# Patient Record
Sex: Male | Born: 1950 | Race: White | Hispanic: No | Marital: Married | Smoking: Former smoker
Health system: Southern US, Community
[De-identification: ages and names within clinical notes are randomized; demographics above are authoritative.]

## PROBLEM LIST (undated history)

## (undated) DIAGNOSIS — M1990 Unspecified osteoarthritis, unspecified site: Secondary | ICD-10-CM

## (undated) DIAGNOSIS — K649 Unspecified hemorrhoids: Secondary | ICD-10-CM

## (undated) DIAGNOSIS — E785 Hyperlipidemia, unspecified: Secondary | ICD-10-CM

## (undated) DIAGNOSIS — K5790 Diverticulosis of intestine, part unspecified, without perforation or abscess without bleeding: Secondary | ICD-10-CM

## (undated) DIAGNOSIS — E860 Dehydration: Secondary | ICD-10-CM

## (undated) DIAGNOSIS — H269 Unspecified cataract: Secondary | ICD-10-CM

## (undated) HISTORY — DX: Unspecified cataract: H26.9

## (undated) HISTORY — DX: Dehydration: E86.0

## (undated) HISTORY — DX: Hyperlipidemia, unspecified: E78.5

## (undated) HISTORY — DX: Unspecified hemorrhoids: K64.9

## (undated) HISTORY — DX: Diverticulosis of intestine, part unspecified, without perforation or abscess without bleeding: K57.90

---

## 1997-04-21 ENCOUNTER — Encounter: Payer: Self-pay | Admitting: Family Medicine

## 1997-04-21 LAB — CONVERTED CEMR LAB: PSA: 1 ng/mL

## 1998-02-20 ENCOUNTER — Encounter: Admission: RE | Admit: 1998-02-20 | Discharge: 1998-05-21 | Payer: Self-pay | Admitting: *Deleted

## 1998-05-07 HISTORY — PX: OTHER SURGICAL HISTORY: SHX169

## 2000-06-26 ENCOUNTER — Encounter: Payer: Self-pay | Admitting: Family Medicine

## 2000-06-26 LAB — CONVERTED CEMR LAB: PSA: 0.7 ng/mL

## 2002-06-21 ENCOUNTER — Encounter: Payer: Self-pay | Admitting: Family Medicine

## 2004-12-28 ENCOUNTER — Ambulatory Visit: Payer: Self-pay | Admitting: Family Medicine

## 2005-06-15 ENCOUNTER — Ambulatory Visit: Payer: Self-pay | Admitting: Family Medicine

## 2005-06-17 ENCOUNTER — Ambulatory Visit: Payer: Self-pay | Admitting: Family Medicine

## 2005-07-04 ENCOUNTER — Ambulatory Visit: Payer: Self-pay | Admitting: Family Medicine

## 2006-07-03 ENCOUNTER — Ambulatory Visit: Payer: Self-pay | Admitting: Family Medicine

## 2006-07-07 ENCOUNTER — Ambulatory Visit: Payer: Self-pay | Admitting: Family Medicine

## 2006-07-21 LAB — FECAL OCCULT BLOOD, GUAIAC: Fecal Occult Blood: NEGATIVE

## 2006-07-24 ENCOUNTER — Ambulatory Visit: Payer: Self-pay | Admitting: Family Medicine

## 2007-03-05 ENCOUNTER — Telehealth (INDEPENDENT_AMBULATORY_CARE_PROVIDER_SITE_OTHER): Payer: Self-pay | Admitting: *Deleted

## 2007-03-05 ENCOUNTER — Encounter: Payer: Self-pay | Admitting: Family Medicine

## 2007-03-06 ENCOUNTER — Ambulatory Visit: Payer: Self-pay | Admitting: Family Medicine

## 2007-03-06 DIAGNOSIS — B029 Zoster without complications: Secondary | ICD-10-CM | POA: Insufficient documentation

## 2007-03-09 ENCOUNTER — Telehealth (INDEPENDENT_AMBULATORY_CARE_PROVIDER_SITE_OTHER): Payer: Self-pay | Admitting: *Deleted

## 2007-03-15 ENCOUNTER — Telehealth (INDEPENDENT_AMBULATORY_CARE_PROVIDER_SITE_OTHER): Payer: Self-pay | Admitting: *Deleted

## 2008-12-30 ENCOUNTER — Ambulatory Visit: Payer: Self-pay | Admitting: Family Medicine

## 2009-04-28 ENCOUNTER — Ambulatory Visit: Payer: Self-pay | Admitting: Family Medicine

## 2009-04-30 LAB — CONVERTED CEMR LAB
Albumin: 3.8 g/dL (ref 3.5–5.2)
BUN: 12 mg/dL (ref 6–23)
Calcium: 9.3 mg/dL (ref 8.4–10.5)
Direct LDL: 138.5 mg/dL
GFR calc non Af Amer: 81 mL/min (ref >60)
Glucose, Bld: 106 mg/dL — ABNORMAL HIGH (ref 70–99)
HDL: 31.5 mg/dL — ABNORMAL LOW (ref >39.00)
PSA: 1.03 ng/mL (ref 0.10–4.00)
TSH: 1.48 microintl units/mL (ref 0.35–5.50)
Triglycerides: 131 mg/dL (ref 0.0–149.0)

## 2009-05-07 ENCOUNTER — Ambulatory Visit: Payer: Self-pay | Admitting: Family Medicine

## 2009-05-07 LAB — CONVERTED CEMR LAB: Microalb, Ur: 0.3 mg/dL (ref 0.0–1.9)

## 2009-05-15 ENCOUNTER — Ambulatory Visit: Payer: Self-pay | Admitting: Family Medicine

## 2009-06-01 ENCOUNTER — Encounter (INDEPENDENT_AMBULATORY_CARE_PROVIDER_SITE_OTHER): Payer: Self-pay | Admitting: *Deleted

## 2009-10-21 ENCOUNTER — Ambulatory Visit: Payer: Self-pay | Admitting: Family Medicine

## 2009-10-21 DIAGNOSIS — M719 Bursopathy, unspecified: Secondary | ICD-10-CM

## 2009-10-21 DIAGNOSIS — M67919 Unspecified disorder of synovium and tendon, unspecified shoulder: Secondary | ICD-10-CM | POA: Insufficient documentation

## 2010-05-05 ENCOUNTER — Ambulatory Visit: Payer: Self-pay | Admitting: Family Medicine

## 2010-06-14 ENCOUNTER — Encounter (INDEPENDENT_AMBULATORY_CARE_PROVIDER_SITE_OTHER): Payer: Self-pay | Admitting: *Deleted

## 2010-11-07 DIAGNOSIS — D126 Benign neoplasm of colon, unspecified: Secondary | ICD-10-CM

## 2010-11-07 HISTORY — DX: Benign neoplasm of colon, unspecified: D12.6

## 2010-12-07 NOTE — Assessment & Plan Note (Signed)
Summary: BACK PAIN X 2 DAYS   Vital Signs:  Patient profile:   60 year old male Weight:      165 pounds Temp:     98.3 degrees F oral Pulse rate:   76 / minute Pulse rhythm:   regular BP sitting:   128 / 84  (left arm) Cuff size:   regular  Vitals Entered By: Sydell Axon LPN (May 05, 2010 2:34 PM) CC: Back pain, left side that goes down into the left leg X 2 days   History of Present Illness: Pt here for back pain after using garden tiller for three tanks of gas lasyt week. He has pain in the left buttock into the left posterior thigh and calf. His sxs are worst at night in bed. He tightens up easily in the cold air of AC. He has taken Tyl. He sneezed this AM and wonders about taking a cold.  He has strated exercises again.  Problems Prior to Update: 1)  Rotator Cuff Injury, Left Shoulder  (ICD-726.10) 2)  Health Maintenance Exam  (ICD-V70.0) 3)  Special Screening Malignant Neoplasm of Prostate  (ICD-V76.44) 4)  Herpes Zoster  (ICD-053.9) 5)  Hyperglycemia (112)  (ICD-790.6) 6)  Hypercholesterolemia, Mixed, Ldl 135/chol 215/ Trig 220  (ICD-272.0)  Medications Prior to Update: 1)  Fish Oil Concentrate 1000 Mg Caps (Omega-3 Fatty Acids) .... Take One By Mouth Daily  Allergies: No Known Drug Allergies  Physical Exam  General:  Well-developed,well-nourished,in no acute distress; alert,appropriate and cooperative throughout examination, no congestion heard. Head:  Normocephalic and atraumatic without obvious abnormalities. No apparent alopecia or balding. Eyes:  Conjunctiva clear bilaterally.  Abdomen:  Bowel sounds positive,abdomen soft and non-tender without masses, organomegaly or hernias noted. Msk:  Waist flexion lacks 12 " of floor, ext to 35 degrees with LL back discomfort. L lat ok, R 35 degrees, R twist to 30 degrees with LL discomfort, l TWIST OK. DTRs at the knee nml. SLR minimally pos at 85 degrees on the left, R nml.    Impression & Recommendations:  Problem  # 1:  LUMBAR STRAIN, ACUTE, LEFT (ICD-847.2) Assessment New Recuurence of old injury. Manipulatuion done with reasonable improvement. Start IBP and Flexeril regularly, flex for 10-14 days, Ibp 3-4 weeks. Heat and ice as described. CAll if doesn't improve in one month.  Complete Medication List: 1)  Fish Oil Concentrate 1000 Mg Caps (Omega-3 fatty acids) .... Take one by mouth daily 2)  Ibuprofen 800 Mg Tabs (Ibuprofen) .... One tab by mouth after each meal. 3)  Flexeril 10 Mg Tabs (Cyclobenzaprine hcl) .... 1/2-1 tab by mouth three times a day as needed for musc relaxation  Patient Instructions: 1)  Manipulation done with reasonable results. Prescriptions: FLEXERIL 10 MG TABS (CYCLOBENZAPRINE HCL) 1/2-1 tab by mouth three times a day as needed for musc relaxation  #50 x 0   Entered and Authorized by:   Shaune Leeks MD   Signed by:   Shaune Leeks MD on 05/05/2010   Method used:   Electronically to        [facility]. [address] [phone]* (retail)       [address] [place], [place]  [address]       Ph: [phone]       Fax: [phone]   RxID:   [id number] IBUPROFEN 800 MG TABS (IBUPROFEN) one tab by mouth after each meal.  #90 x 1   Entered and Authorized  by:   Shaune Leeks MD   Signed by:   Shaune Leeks MD on 05/05/2010   Method used:   Electronically to        [facility]. [address] [phone]* (retail)       [address] [place], [place]  [address]       Ph: [phone]       Fax: [phone]   RxID:   [id number]   Current Allergies (reviewed today): No known allergies

## 2010-12-07 NOTE — Letter (Signed)
Summary: Nadara Eaton letter  [facility]  [address] [place], [place] [address]   Phone: [phone]  Fax: [phone]       06/14/2010 MRN: [id number]  ARDEN TINOCO [address] [place], [place]  [address]  Dear Mr. [facility] [place], and [facility] announce the retirement of Arta Silence, M.D., from full-time practice at the [facility] office effective May 06, 2010 and his plans of returning part-time.  It is important to Dr. Hetty Ely and to our practice that you understand that [facility] has seven physicians in our office for your health care needs.  We will continue to offer the same exceptional care that you have today.    Dr. Hetty Ely has spoken to many of you about his plans for retirement and returning part-time in the fall.   We will continue to work with you through the transition to schedule appointments for you in the office and meet the high standards that [facility] is committed to.   Again, it is with great pleasure that we share the news that Dr. Hetty Ely will return to [facility] in October of 2011 with a reduced schedule.    If you have any questions, or would like to request an appointment with one of our physicians, please call us at [phone] and press the option for Scheduling an appointment.  We take pleasure in providing you with excellent patient care and look forward to seeing you at your next office visit.  Our [facility] Physicians are:  Tillman Abide, M.D. Laurita Quint, M.D. Roxy Manns, M.D. Kerby Nora, M.D. Hannah Beat, M.D. Ruthe Mannan, M.D. We proudly welcomed Raechel Ache, M.D. and Eustaquio Boyden, M.D. to the practice in July/August 2011.  Sincerely,  [facility] [place]

## 2011-03-08 DIAGNOSIS — E860 Dehydration: Secondary | ICD-10-CM

## 2011-03-08 HISTORY — DX: Dehydration: E86.0

## 2011-04-01 ENCOUNTER — Observation Stay (HOSPITAL_COMMUNITY)
Admission: EM | Admit: 2011-04-01 | Discharge: 2011-04-03 | Disposition: A | Discharge status: Home / Self Care | Payer: 59 | Attending: Internal Medicine | Admitting: Internal Medicine

## 2011-04-01 ENCOUNTER — Emergency Department (HOSPITAL_COMMUNITY): Payer: 59

## 2011-04-01 DIAGNOSIS — R112 Nausea with vomiting, unspecified: Secondary | ICD-10-CM | POA: Insufficient documentation

## 2011-04-01 DIAGNOSIS — E785 Hyperlipidemia, unspecified: Secondary | ICD-10-CM | POA: Insufficient documentation

## 2011-04-01 DIAGNOSIS — R55 Syncope and collapse: Principal | ICD-10-CM | POA: Insufficient documentation

## 2011-04-01 LAB — CBC
HCT: 40.5 % (ref 39.0–52.0)
HCT: 40.7 % (ref 39.0–52.0)
Hemoglobin: 14.4 g/dL (ref 13.0–17.0)
Hemoglobin: 14.9 g/dL (ref 13.0–17.0)
MCH: 31.4 pg (ref 26.0–34.0)
MCHC: 35.6 g/dL (ref 30.0–36.0)
MCHC: 36.6 g/dL — ABNORMAL HIGH (ref 30.0–36.0)
MCV: 85.9 fL (ref 78.0–100.0)

## 2011-04-01 LAB — DIFFERENTIAL
Basophils Absolute: 0 10*3/uL (ref 0.0–0.1)
Basophils Relative: 0 % (ref 0–1)
Eosinophils Relative: 0 % (ref 0–5)
Lymphocytes Relative: 14 % (ref 12–46)
Lymphs Abs: 1.3 10*3/uL (ref 0.7–4.0)
Lymphs Abs: 1.8 10*3/uL (ref 0.7–4.0)
Monocytes Absolute: 0.9 10*3/uL (ref 0.1–1.0)
Monocytes Absolute: 1.2 10*3/uL — ABNORMAL HIGH (ref 0.1–1.0)
Monocytes Relative: 8 % (ref 3–12)
Neutro Abs: 12 10*3/uL — ABNORMAL HIGH (ref 1.7–7.7)
Neutro Abs: 9.5 10*3/uL — ABNORMAL HIGH (ref 1.7–7.7)

## 2011-04-01 LAB — BASIC METABOLIC PANEL
BUN: 13 mg/dL (ref 6–23)
CO2: 24 mEq/L (ref 19–32)
Calcium: 9.7 mg/dL (ref 8.4–10.5)
Creatinine, Ser: 1.36 mg/dL (ref 0.4–1.5)
Glucose, Bld: 149 mg/dL — ABNORMAL HIGH (ref 70–99)
Sodium: 143 mEq/L (ref 135–145)

## 2011-04-01 LAB — CK TOTAL AND CKMB (NOT AT ARMC): CK, MB: 1.2 ng/mL (ref 0.3–4.0)

## 2011-04-02 LAB — HEMOGLOBIN A1C
Hgb A1c MFr Bld: 5.5 % (ref <5.7)
Mean Plasma Glucose: 111 mg/dL (ref <117)

## 2011-04-02 LAB — CARDIAC PANEL(CRET KIN+CKTOT+MB+TROPI)
Relative Index: INVALID (ref 0.0–2.5)
Relative Index: INVALID (ref 0.0–2.5)
Total CK: 90 U/L (ref 7–232)

## 2011-04-02 LAB — BASIC METABOLIC PANEL
BUN: 10 mg/dL (ref 6–23)
CO2: 23 mEq/L (ref 19–32)
Chloride: 106 mEq/L (ref 96–112)
Creatinine, Ser: 0.86 mg/dL (ref 0.4–1.5)

## 2011-04-02 LAB — COMPREHENSIVE METABOLIC PANEL
ALT: 12 U/L (ref 0–53)
AST: 22 U/L (ref 0–37)
Calcium: 9.2 mg/dL (ref 8.4–10.5)
GFR calc Af Amer: >60 mL/min (ref >60)
Sodium: 139 mEq/L (ref 135–145)
Total Protein: 6.6 g/dL (ref 6.0–8.3)

## 2011-04-02 LAB — LIPID PANEL
Cholesterol: 183 mg/dL (ref 0–200)
Triglycerides: 129 mg/dL (ref <150)

## 2011-04-05 ENCOUNTER — Encounter: Payer: Self-pay | Admitting: Family Medicine

## 2011-04-05 NOTE — H&P (Signed)
[name], Barker NO.:  [id number]  MEDICAL RECORD NO.:  [id number]           PATIENT TYPE:  E  LOCATION:  [facility]                         FACILITY:  [facility]  PHYSICIAN:  [name], MDDATE OF BIRTH:  03/26/1951  DATE OF ADMISSION:  04/01/2011 DATE OF DISCHARGE:                             HISTORY & PHYSICAL   PRIMARY CARE PROVIDER:  Dr. Karma Ganja with [facility].  CHIEF COMPLAINT:  Syncope.  The patient is a 60 year old gentleman who has no prior medical history, fairly healthy, does smoke.  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  He was working without air conditioning, although he did take some water with him and drink some water as well as some Dr Reino Kent.  At the end of the day, he felt very hot.  He got into a truck to go home.  The truck was also very hot.  The condition was turned on but took a little while to kick in.  He started to feel unwell, apparently dropped his glasses out of his hands, and then became unresponsive.  His brother got out of the car, walked around to the other side.  There was no report of him losing pulse or stopped breathing, but he was not responding to the verbal stimulation provided by his brother, at which point he drove him down the road to the fire station.  By that time, he completely recovered and was able to step out of the vehicle.  He was still feeling somewhat hot.  Prior to this his syncope, he apparently vomited, although he cannot remember this.  There was no reported loss of bladder or bowel continence.  No seizure-like activity was reported.  In the emergency department, the patient was back to his baseline.  He never had any chest pains or shortness of breath associated with it.  No leg swelling.  He denies ever having this happened before.  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  Otherwise, review of systems is negative.  PAST MEDICAL HISTORY:  Unremarkable.  SOCIAL HISTORY:  He never smoked or drink or abuse drugs.  He is retired.  FAMILY HISTORY:  Significant for father who died at the age of 21 of coronary artery disease and heart attack who did drink, smoked some cigars.  Brother has passed away with esophageal cancer.  His mother has diabetes.  ALLERGIES:  None.  MEDICATIONS:  None.  REVIEW OF SYSTEMS:  He cannot recall if anybody noticed that he was appearing hot to them.  He did seem to have a little bit of chills once he got into the emergency department.  He has not had any fevers.  He was not diaphoretic.  He does notice that he has only urinated once throughout the day today.  PHYSICAL EXAMINATION:  VITALS:  Temperature 97.6, blood pressure 108/78 sitting and 116/72, lying down 120/76 standing up.  He does not appear to be truly orthostatic though it seems like orthostatics  were done quite correctly.  Respirations 20, sating 100% on room air. GENERAL:  The patient appears to be in no acute distress. HEAD:  Nontraumatic.  Moist mucous membranes. LUNGS:  Clear to auscultation bilaterally. HEART:  Regular rate and rhythm.  No murmurs, rubs, or gallops. LOWER EXTREMITIES:  Without clubbing, cyanosis, or edema. NEUROLOGIC:  Grossly intact.  Strength 5/5 in all four extremities. Cranial nerves II through XII intact. SKIN:  Clean, dry.  Not hot at this point.  LABORATORY DATA:  White blood cell count 14.5, hemoglobin 14.9, sodium 143, potassium 3.8, creatinine 1.36.  Cardiac enzymes unremarkable.  CT scan of the head is unremarkable.  Chest x-ray was unremarkable.  EKG showing T-wave flattening in lead V2.  There is a T-wave inversion in V1, but otherwise it is unremarkable.  No acute changes suggestive of ischemia.  ASSESSMENT AND PLAN:  This is a 60 year old gentleman with most likely heat exhaustion resulted in syncope, but given  his age and family history we would admit for further evaluation and evaluation of causes of his syncope. 1. Syncope likely secondary to head exhaustion, but we will admit,     give aggressive IV fluids, cycle cardiac enzymes, check 2-D echo.     We will check carotid Dopplers given his age.  Further risk     stratify.  Fasting lipid panel, hemoglobin A1c.  We will also check     TSH.  Check serial EKGs. 2. Leukocytosis.  I suspect that he is hemoconcentrated but just in     case we will check a UA.  His lack of urination also do suggest     that he is somewhat dehydrated.  He does feel thirsty at this     point. 3. Prophylaxis.  Lovenox.  Good p.o. intake.     [name], MD     AVD/MEDQ  D:  04/01/2011  T:  04/01/2011  Job:  [id number]  cc:   Dr. Karma Ganja.  Electronically Signed by Therisa Doyne MD on 04/05/2011 06:00:10 AM

## 2011-04-07 ENCOUNTER — Ambulatory Visit (INDEPENDENT_AMBULATORY_CARE_PROVIDER_SITE_OTHER): Payer: 59 | Admitting: Family Medicine

## 2011-04-07 ENCOUNTER — Encounter: Payer: Self-pay | Admitting: Family Medicine

## 2011-04-07 VITALS — BP 108/68 | HR 72 | Temp 97.4°F | Wt 165.8 lb

## 2011-04-07 DIAGNOSIS — R55 Syncope and collapse: Secondary | ICD-10-CM

## 2011-04-07 DIAGNOSIS — E860 Dehydration: Secondary | ICD-10-CM

## 2011-04-07 NOTE — Progress Notes (Signed)
  Subjective:    Patient ID: Bryan Barker, male    DOB: April 14, 1951, 60 y.o.   MRN: [id number]  HPI Pt here for [facility] followup. He was admitted for dehydration and syncope therefrom. He now feels fine. He was rehydrated, had multiple labs significant only for leukocytosis presumed due to the dehydration as there was no evidence of infection on U/A, CXR, CT of head or PE. ECHO was unremarkable. Carotids were ordered but I do not have that result.    Review of SystemsNoncontributory except as above.      Objective:   Physical Exam  Constitutional: He appears well-developed and well-nourished. No distress.  HENT:  Head: Normocephalic and atraumatic.  Right Ear: External ear normal.  Left Ear: External ear normal.  Nose: Nose normal.  Mouth/Throat: Oropharynx is clear and moist.  Eyes: Conjunctivae and EOM are normal. Pupils are equal, round, and reactive to light. Right eye exhibits no discharge. Left eye exhibits no discharge.  Neck: Normal range of motion. Neck supple.  Cardiovascular: Normal rate and regular rhythm.   Pulmonary/Chest: Effort normal and breath sounds normal. He has no wheezes.  Lymphadenopathy:    He has no cervical adenopathy.  Skin: He is not diaphoretic.          Assessment & Plan:  Dehydration Syncope  All labs and tests returned nml except Carotids still pending. Discussed proper hydration technique. RTC if sxs recur.

## 2011-04-07 NOTE — Patient Instructions (Addendum)
Please schedule pt in open in Jul for Comp Exam, labs prior. Pt was given script for Simva 10 upon discharge from the hosp for LDL 133. Will wait on that and recheck with PE scheduled as above.

## 2011-05-12 ENCOUNTER — Other Ambulatory Visit: Payer: Self-pay | Admitting: Family Medicine

## 2011-05-12 DIAGNOSIS — R739 Hyperglycemia, unspecified: Secondary | ICD-10-CM

## 2011-05-12 DIAGNOSIS — Z125 Encounter for screening for malignant neoplasm of prostate: Secondary | ICD-10-CM

## 2011-05-12 DIAGNOSIS — E7800 Pure hypercholesterolemia, unspecified: Secondary | ICD-10-CM

## 2011-05-13 ENCOUNTER — Other Ambulatory Visit (INDEPENDENT_AMBULATORY_CARE_PROVIDER_SITE_OTHER): Payer: 59 | Admitting: Family Medicine

## 2011-05-13 DIAGNOSIS — Z125 Encounter for screening for malignant neoplasm of prostate: Secondary | ICD-10-CM

## 2011-05-13 DIAGNOSIS — R7309 Other abnormal glucose: Secondary | ICD-10-CM

## 2011-05-13 DIAGNOSIS — R739 Hyperglycemia, unspecified: Secondary | ICD-10-CM

## 2011-05-13 DIAGNOSIS — E7800 Pure hypercholesterolemia, unspecified: Secondary | ICD-10-CM

## 2011-05-13 LAB — LIPID PANEL
Cholesterol: 215 mg/dL — ABNORMAL HIGH (ref 0–200)
HDL: 38.5 mg/dL — ABNORMAL LOW (ref >39.00)
Total CHOL/HDL Ratio: 6
VLDL: 31 mg/dL (ref 0.0–40.0)

## 2011-05-13 LAB — PSA: PSA: 1.28 ng/mL (ref 0.10–4.00)

## 2011-05-13 LAB — RENAL FUNCTION PANEL
Albumin: 4.5 g/dL (ref 3.5–5.2)
BUN: 20 mg/dL (ref 6–23)
CO2: 26 mEq/L (ref 19–32)
Creatinine, Ser: 1 mg/dL (ref 0.4–1.5)
GFR: 76.9 mL/min (ref >60.00)

## 2011-05-13 LAB — MICROALBUMIN / CREATININE URINE RATIO
Microalb Creat Ratio: 0.2 mg/g (ref 0.0–30.0)
Microalb, Ur: 0.5 mg/dL (ref 0.0–1.9)

## 2011-05-13 LAB — HEPATIC FUNCTION PANEL
AST: 22 U/L (ref 0–37)
Alkaline Phosphatase: 64 U/L (ref 39–117)
Total Bilirubin: 0.8 mg/dL (ref 0.3–1.2)

## 2011-05-19 ENCOUNTER — Encounter: Payer: Self-pay | Admitting: Internal Medicine

## 2011-05-19 ENCOUNTER — Encounter: Payer: Self-pay | Admitting: Family Medicine

## 2011-05-19 ENCOUNTER — Ambulatory Visit (INDEPENDENT_AMBULATORY_CARE_PROVIDER_SITE_OTHER): Payer: 59 | Admitting: Family Medicine

## 2011-05-19 DIAGNOSIS — E860 Dehydration: Secondary | ICD-10-CM | POA: Insufficient documentation

## 2011-05-19 DIAGNOSIS — E7800 Pure hypercholesterolemia, unspecified: Secondary | ICD-10-CM

## 2011-05-19 DIAGNOSIS — Z8371 Family history of colonic polyps: Secondary | ICD-10-CM

## 2011-05-19 MED ORDER — PRAVASTATIN SODIUM 40 MG PO TABS: 40.0000 mg | ORAL_TABLET | Freq: Every evening | ORAL | Status: DC

## 2011-05-19 NOTE — Assessment & Plan Note (Signed)
No p[roblems since being seen. BMET ok. Problem resolved.

## 2011-05-19 NOTE — Assessment & Plan Note (Signed)
Will refer for colonoscopy. Mom has had polyps on two different colonoscopies.

## 2011-05-19 NOTE — Assessment & Plan Note (Signed)
LDL too high. Will start Pravastatin 40. Discussed risks and benefits. Lab Results  Component Value Date   CHOL 215* 05/13/2011   CHOL 183 04/02/2011   CHOL 202* 04/28/2009   Lab Results  Component Value Date   HDL 38.50* 05/13/2011   HDL 31* 04/02/2011   HDL 31.50* 04/28/2009   Lab Results  Component Value Date   LDLCALC  Value: 126        Total Cholesterol/HDL:CHD Risk Coronary Heart Disease Risk Table                     Men   Women  1/2 Average Risk   3.4   3.3  Average Risk       5.0   4.4  2 X Average Risk   9.6   7.1  3 X Average Risk  23.4   11.0        Use the calculated Patient Ratio above and the CHD Risk Table to determine the patient's CHD Risk.        ATP III CLASSIFICATION (LDL):  <100     mg/dL   Optimal  045-409  mg/dL   Near or Above                    Optimal  130-159  mg/dL   Borderline  811-914  mg/dL   High  >782     mg/dL   Very High* 9/56/2130   Lab Results  Component Value Date   TRIG 155.0* 05/13/2011   TRIG 129 04/02/2011   TRIG 131.0 04/28/2009   Lab Results  Component Value Date   CHOLHDL 6 05/13/2011   CHOLHDL 5.9 04/02/2011   CHOLHDL 6 04/28/2009   Lab Results  Component Value Date   LDLDIRECT 153.9 05/13/2011   LDLDIRECT 138.5 04/28/2009

## 2011-05-19 NOTE — Progress Notes (Signed)
Subjective:    Patient ID: Bryan Barker, male    DOB: 09/19/1951, 60 y.o.   MRN: [id number]  HPIPt here for Comp Exam. He was seen a little while ago in followup from hosp fopr dehydration with negative workup. He has done well with no problems since. He has no complaints today.    Review of Systems  Constitutional: Negative for fever, chills, diaphoresis, appetite change, fatigue and unexpected weight change.  HENT: Negative for hearing loss, ear pain, tinnitus and ear discharge.   Eyes: Negative for pain, discharge and visual disturbance.  Respiratory: Negative for cough, shortness of breath and wheezing.   Cardiovascular: Negative for chest pain and palpitations.       No SOB w/ exertion  Gastrointestinal: Negative for nausea, vomiting, abdominal pain, diarrhea, constipation and blood in stool.       No heartburn or swallowing problems.  Genitourinary: Negative for dysuria, frequency and difficulty urinating.       No nocturia  Musculoskeletal: Negative for myalgias, back pain and arthralgias.  Skin: Negative for rash.       No itching or dryness.  Neurological: Negative for tremors and numbness.       No tingling or balance problems.  Hematological: Negative for adenopathy. Does not bruise/bleed easily.  Psychiatric/Behavioral: Negative for dysphoric mood and agitation.   No Known Allergies Current Outpatient Prescriptions on File Prior to Visit  Medication Sig Dispense Refill  . ibuprofen (ADVIL,MOTRIN) 800 MG tablet as needed.       . Omega-3 Fatty Acids (FISH OIL) 1000 MG CAPS Take by mouth daily.         Past Medical History  Diagnosis Date  . Dehydration 03/2011   History   Social History  . Marital Status: Married    Spouse Name: N/A    Number of Children: 2  . Years of Education: N/A   Occupational History  . 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 Smoking status: Former Smoker -- 1.0 packs/day for 3 years  .  Smokeless tobacco: Never Used   Comment: only smoked as a teenager  . Alcohol Use: No  . Drug Use: No  . Sexually Active: Yes   Other Topics Concern  . Not on file   Social History Narrative   Married and lives with wife2 daughters   Family History  Problem Relation Age of Onset  . Diabetes Mother   . Hypertension Mother   . Heart disease Mother     Pacer  . Heart disease Father     MI  . Cancer Brother     esoph, mets to brain  . Diabetes Brother   . Alcohol abuse Sister       BP 106/68  Pulse 60  Temp(Src) 97.1 F (36.2 C) (Oral)  Ht 5\' 8"  (1.727 m)  Wt 162 lb 8 oz (73.71 kg)  BMI 24.71 kg/m2  Objective:   Physical Exam  Constitutional: He is oriented to person, place, and time. He appears well-developed and well-nourished. No distress.  HENT:  Head: Normocephalic and atraumatic.  Right Ear: External ear normal.  Left Ear: External ear normal.  Nose: Nose normal.  Mouth/Throat: Oropharynx is clear and moist.  Eyes: Conjunctivae and EOM are normal. Pupils are equal, round, and reactive to light. Right eye exhibits no discharge. Left eye exhibits no discharge. No scleral icterus.  Neck: Normal range of motion. Neck supple. No thyromegaly  present.  Cardiovascular: Normal rate, regular rhythm, normal heart sounds and intact distal pulses.   No murmur heard. Pulmonary/Chest: Effort normal and breath sounds normal. No respiratory distress. He has no wheezes.  Abdominal: Soft. Bowel sounds are normal. He exhibits no distension and no mass. There is no tenderness. There is no rebound and no guarding.  Genitourinary: Rectum normal, prostate normal and penis normal. Guaiac negative stool.  Musculoskeletal: Normal range of motion. He exhibits no edema.  Lymphadenopathy:    He has no cervical adenopathy.  Neurological: He is alert and oriented to person, place, and time. Coordination normal.  Skin: Skin is warm and dry. No rash noted. He is not diaphoretic.  Psychiatric:  He has a normal mood and affect. His behavior is normal. Judgment and thought content normal.          Assessment & Plan:  HMPE

## 2011-05-19 NOTE — Patient Instructions (Signed)
Refer for Colonoscopy SGOT, SGPT 272.4    6 WEEKS See me in 3 mos, SGOT, SGPT, CHOL PROFILE  272.4     prior

## 2011-05-25 NOTE — Discharge Summary (Signed)
  NAMENED, KAKAR NO.:  [id number]  MEDICAL RECORD NO.:  [id number]  LOCATION:  4740                         FACILITY:  [facility]  PHYSICIAN:  Zannie Cove, MD     DATE OF BIRTH:  [birth year]  DATE OF ADMISSION:  04/01/2011 DATE OF DISCHARGE:  04/03/2011                              DISCHARGE SUMMARY   PRIMARY CARE PHYSICIAN:  Arta Silence, MD  DISCHARGE DIAGNOSES: 1. Syncope likely related to heat. 2. Dyslipidemia.  DISCHARGE MEDICATIONS: 1. Aspirin 81 mg daily. 2. Simvastatin 10 mg at bedtime. 3. Fish oil 1 tablet b.i.d.  DIAGNOSTIC INVESTIGATIONS: 1. CT of the head was normal.  Chest x-ray shows no active     cardiopulmonary abnormalities. 2. A 2-D echo May 27 showed LV cavity size normal, wall thickness     normal, systolic function EF of 60-65%, wall motion is normal.  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  He was admitted to the hospital for further evaluation.  Syncope was felt to be secondary to heat and exhaustion. He was treated with IV fluids.  Cardiac markers, 2-D echo, etc., were unremarkable.  He did not have any arrhythmias on telemetry.  Also had a fasting a lipid panel, hemoglobin A1c.  LDL was elevated at 160. Advised lifestyle modification and started on low-dose statin. Subsequently discharged home in stable condition to follow up with his primary physician Dr. Hetty Ely.     Zannie Cove, MD     PJ/MEDQ  D:  05/24/2011  T:  05/24/2011  Job:  [id number]  cc:   Arta Silence, MD  Electronically Signed by Zannie Cove  on 05/25/2011 01:22:52 PM

## 2011-06-17 ENCOUNTER — Encounter: Payer: Self-pay | Admitting: Internal Medicine

## 2011-06-17 ENCOUNTER — Ambulatory Visit (AMBULATORY_SURGERY_CENTER): Payer: 59 | Admitting: *Deleted

## 2011-06-17 VITALS — Ht 68.0 in | Wt 166.7 lb

## 2011-06-17 DIAGNOSIS — Z1211 Encounter for screening for malignant neoplasm of colon: Secondary | ICD-10-CM

## 2011-06-17 MED ORDER — PEG-KCL-NACL-NASULF-NA ASC-C 100 G PO SOLR: ORAL | Status: DC

## 2011-06-20 ENCOUNTER — Other Ambulatory Visit: Payer: Self-pay | Admitting: Family Medicine

## 2011-06-20 DIAGNOSIS — E7800 Pure hypercholesterolemia, unspecified: Secondary | ICD-10-CM

## 2011-06-30 ENCOUNTER — Other Ambulatory Visit (INDEPENDENT_AMBULATORY_CARE_PROVIDER_SITE_OTHER): Payer: 59 | Admitting: Family Medicine

## 2011-06-30 DIAGNOSIS — E7800 Pure hypercholesterolemia, unspecified: Secondary | ICD-10-CM

## 2011-06-30 HISTORY — PX: COLONOSCOPY: SHX174

## 2011-06-30 LAB — LIPID PANEL
HDL: 34.8 mg/dL — ABNORMAL LOW (ref >39.00)
LDL Cholesterol: 78 mg/dL (ref 0–99)
Total CHOL/HDL Ratio: 4
VLDL: 25.4 mg/dL (ref 0.0–40.0)

## 2011-06-30 LAB — AST: AST: 28 U/L (ref 0–37)

## 2011-06-30 LAB — ALT: ALT: 29 U/L (ref 0–53)

## 2011-07-01 ENCOUNTER — Encounter: Payer: Self-pay | Admitting: Internal Medicine

## 2011-07-01 ENCOUNTER — Ambulatory Visit (AMBULATORY_SURGERY_CENTER): Payer: 59 | Admitting: Internal Medicine

## 2011-07-01 VITALS — BP 155/69 | HR 60 | Temp 97.0°F | Resp 16 | Ht 68.0 in | Wt 166.0 lb

## 2011-07-01 DIAGNOSIS — Z1211 Encounter for screening for malignant neoplasm of colon: Secondary | ICD-10-CM

## 2011-07-01 DIAGNOSIS — D126 Benign neoplasm of colon, unspecified: Secondary | ICD-10-CM

## 2011-07-01 DIAGNOSIS — K5730 Diverticulosis of large intestine without perforation or abscess without bleeding: Secondary | ICD-10-CM

## 2011-07-01 DIAGNOSIS — D1330 Benign neoplasm of unspecified part of small intestine: Secondary | ICD-10-CM

## 2011-07-01 MED ORDER — IBUPROFEN 200 MG PO TABS: 400.0000 mg | ORAL_TABLET | Freq: Four times a day (QID) | ORAL | Status: DC | PRN

## 2011-07-01 MED ORDER — ASPIRIN 81 MG PO TABS: 81.0000 mg | ORAL_TABLET | Freq: Every day | ORAL | Status: DC

## 2011-07-01 MED ORDER — SODIUM CHLORIDE 0.9 % IV SOLN: 500.0000 mL | INTRAVENOUS | Status: DC

## 2011-07-01 NOTE — Patient Instructions (Addendum)
Four polyps were removed. They all look benign but I will let you know what the pathologist tells [place] about them. i suspect that I will recommend a repeat routine colonoscopy in 3 years due to the polyps - will confirm that in the letter we send you. Do not take aspirin, ibuprofen or other anti-inflammatories until 07/15/11. Iva Boop, MD, [facility]  Please refer to your blue and neon green sheets for instructions regarding diet and activity for the rest of today.  HOLD ASPIRIN & ALL ASPIRIN containing products for the next two weeks (07/15/2011).  OK to resume all other medications as you would normally take them.

## 2011-07-04 ENCOUNTER — Telehealth: Payer: Self-pay | Admitting: *Deleted

## 2011-07-04 NOTE — Telephone Encounter (Signed)

## 2011-07-06 ENCOUNTER — Encounter: Payer: Self-pay | Admitting: Internal Medicine

## 2011-07-06 DIAGNOSIS — Z86010 Personal history of colonic polyps: Secondary | ICD-10-CM | POA: Insufficient documentation

## 2011-07-21 ENCOUNTER — Encounter: Payer: Self-pay | Admitting: Internal Medicine

## 2011-07-21 DIAGNOSIS — Z86010 Personal history of colonic polyps: Secondary | ICD-10-CM

## 2011-07-23 ENCOUNTER — Encounter: Payer: Self-pay | Admitting: Family Medicine

## 2011-07-23 DIAGNOSIS — K5790 Diverticulosis of intestine, part unspecified, without perforation or abscess without bleeding: Secondary | ICD-10-CM | POA: Insufficient documentation

## 2011-08-15 ENCOUNTER — Other Ambulatory Visit: Payer: Self-pay | Admitting: Family Medicine

## 2011-08-15 DIAGNOSIS — E7800 Pure hypercholesterolemia, unspecified: Secondary | ICD-10-CM

## 2011-08-17 ENCOUNTER — Other Ambulatory Visit: Payer: 59

## 2011-08-18 ENCOUNTER — Other Ambulatory Visit (INDEPENDENT_AMBULATORY_CARE_PROVIDER_SITE_OTHER): Payer: 59

## 2011-08-18 DIAGNOSIS — E7800 Pure hypercholesterolemia, unspecified: Secondary | ICD-10-CM

## 2011-08-18 LAB — LIPID PANEL
Cholesterol: 152 mg/dL (ref 0–200)
HDL: 38.2 mg/dL — ABNORMAL LOW (ref >39.00)
LDL Cholesterol: 86 mg/dL (ref 0–99)
Total CHOL/HDL Ratio: 4
Triglycerides: 139 mg/dL (ref 0.0–149.0)

## 2011-08-18 LAB — HEPATIC FUNCTION PANEL
AST: 27 U/L (ref 0–37)
Albumin: 4.4 g/dL (ref 3.5–5.2)
Alkaline Phosphatase: 67 U/L (ref 39–117)
Total Protein: 7.8 g/dL (ref 6.0–8.3)

## 2011-08-23 ENCOUNTER — Ambulatory Visit (INDEPENDENT_AMBULATORY_CARE_PROVIDER_SITE_OTHER): Payer: 59 | Admitting: Family Medicine

## 2011-08-23 ENCOUNTER — Encounter: Payer: Self-pay | Admitting: Family Medicine

## 2011-08-23 VITALS — BP 100/68 | HR 76 | Temp 97.8°F | Wt 166.0 lb

## 2011-08-23 DIAGNOSIS — Z23 Encounter for immunization: Secondary | ICD-10-CM

## 2011-08-23 DIAGNOSIS — B029 Zoster without complications: Secondary | ICD-10-CM

## 2011-08-23 DIAGNOSIS — E7800 Pure hypercholesterolemia, unspecified: Secondary | ICD-10-CM

## 2011-08-23 NOTE — Progress Notes (Signed)
  Subjective:    Patient ID: Bryan Barker, male    DOB: 1951/01/06, 60 y.o.   MRN: [id number]  HPI Pt here for three month followup of Chol. He is tolerating his medication well without any problems. He wonders about getting a shingles shot. He has had the shingles.  He would like a flu shot today.    Review of Systems  Constitutional: Negative for fever, chills, diaphoresis, activity change, appetite change and fatigue.  HENT: Negative for hearing loss, ear pain, congestion, sore throat, rhinorrhea, neck pain, neck stiffness, postnasal drip, sinus pressure, tinnitus and ear discharge.   Eyes: Negative for pain, discharge and visual disturbance.  Respiratory: Negative for cough, shortness of breath and wheezing.   Cardiovascular: Negative for chest pain and palpitations.       No SOB w/ exertion  Gastrointestinal:       No heartburn or swallowing problems.  Genitourinary:       No nocturia  Skin:       No itching or dryness.  Neurological:       No tingling or balance problems.  All other systems reviewed and are negative.       Objective:   Physical Exam  Constitutional: He appears well-developed and well-nourished. No distress.  HENT:  Head: Normocephalic and atraumatic.  Right Ear: External ear normal.  Left Ear: External ear normal.  Nose: Nose normal.  Mouth/Throat: Oropharynx is clear and moist.  Eyes: Conjunctivae and EOM are normal. Pupils are equal, round, and reactive to light. Right eye exhibits no discharge. Left eye exhibits no discharge.  Neck: Normal range of motion. Neck supple.  Cardiovascular: Normal rate and regular rhythm.   Pulmonary/Chest: Effort normal and breath sounds normal. He has no wheezes.  Lymphadenopathy:    He has no cervical adenopathy.  Skin: He is not diaphoretic.          Assessment & Plan:

## 2011-08-23 NOTE — Assessment & Plan Note (Signed)
Good result from medication. Continue. Lab Results  Component Value Date   CHOL 152 08/18/2011   HDL 38.20* 08/18/2011   LDLCALC 86 08/18/2011   LDLDIRECT 153.9 05/13/2011   TRIG 139.0 08/18/2011   CHOLHDL 4 08/18/2011

## 2011-08-23 NOTE — Patient Instructions (Signed)
RTC for Comp Exam with Dr Reece Agar in Jul, labs prior

## 2011-08-23 NOTE — Assessment & Plan Note (Signed)
Have pharmacy request injection at end of next month. (Is geptting Flu shot today.)

## 2012-01-31 ENCOUNTER — Other Ambulatory Visit: Payer: Self-pay | Admitting: Family Medicine

## 2012-01-31 ENCOUNTER — Telehealth: Payer: Self-pay | Admitting: Radiology

## 2012-01-31 DIAGNOSIS — Z125 Encounter for screening for malignant neoplasm of prostate: Secondary | ICD-10-CM

## 2012-01-31 DIAGNOSIS — E7800 Pure hypercholesterolemia, unspecified: Secondary | ICD-10-CM

## 2012-01-31 DIAGNOSIS — Z0000 Encounter for general adult medical examination without abnormal findings: Secondary | ICD-10-CM | POA: Insufficient documentation

## 2012-01-31 NOTE — Telephone Encounter (Signed)
Opened in error

## 2012-05-03 ENCOUNTER — Other Ambulatory Visit (INDEPENDENT_AMBULATORY_CARE_PROVIDER_SITE_OTHER): Payer: 59

## 2012-05-03 DIAGNOSIS — Z125 Encounter for screening for malignant neoplasm of prostate: Secondary | ICD-10-CM

## 2012-05-03 DIAGNOSIS — E7800 Pure hypercholesterolemia, unspecified: Secondary | ICD-10-CM

## 2012-05-03 LAB — LIPID PANEL
Cholesterol: 161 mg/dL (ref 0–200)
HDL: 32.7 mg/dL — ABNORMAL LOW (ref >39.00)

## 2012-05-03 LAB — COMPREHENSIVE METABOLIC PANEL
AST: 19 U/L (ref 0–37)
Albumin: 3.9 g/dL (ref 3.5–5.2)
Alkaline Phosphatase: 60 U/L (ref 39–117)
BUN: 13 mg/dL (ref 6–23)
Calcium: 9.5 mg/dL (ref 8.4–10.5)
Chloride: 105 mEq/L (ref 96–112)
Creatinine, Ser: 1 mg/dL (ref 0.4–1.5)
Glucose, Bld: 99 mg/dL (ref 70–99)
Potassium: 4.3 mEq/L (ref 3.5–5.1)

## 2012-05-03 LAB — PSA: PSA: 2.56 ng/mL (ref 0.10–4.00)

## 2012-05-08 ENCOUNTER — Encounter: Payer: 59 | Admitting: Family Medicine

## 2012-05-17 ENCOUNTER — Ambulatory Visit (INDEPENDENT_AMBULATORY_CARE_PROVIDER_SITE_OTHER): Payer: 59 | Admitting: Family Medicine

## 2012-05-17 ENCOUNTER — Encounter: Payer: Self-pay | Admitting: Family Medicine

## 2012-05-17 ENCOUNTER — Telehealth: Payer: Self-pay | Admitting: Family Medicine

## 2012-05-17 ENCOUNTER — Encounter: Payer: 59 | Admitting: Family Medicine

## 2012-05-17 VITALS — BP 140/85 | HR 74 | Temp 97.7°F | Ht 68.0 in | Wt 162.5 lb

## 2012-05-17 DIAGNOSIS — M542 Cervicalgia: Secondary | ICD-10-CM

## 2012-05-17 MED ORDER — IBUPROFEN 800 MG PO TABS: 800.0000 mg | ORAL_TABLET | Freq: Three times a day (TID) | ORAL | Status: AC | PRN

## 2012-05-17 MED ORDER — CYCLOBENZAPRINE HCL 10 MG PO TABS: 10.0000 mg | ORAL_TABLET | Freq: Two times a day (BID) | ORAL | Status: AC | PRN

## 2012-05-17 MED ORDER — PRAVASTATIN SODIUM 40 MG PO TABS: 40.0000 mg | ORAL_TABLET | Freq: Every evening | ORAL | Status: DC

## 2012-05-17 NOTE — Progress Notes (Signed)
  Subjective:    Patient ID: Bryan Barker, male    DOB: Jan 12, 1951, 61 y.o.   MRN: [id number]  HPI CC: muscle pain  Started pravastatin 1 year ago, doing well.  Since last week had to space out pravastatin to QOD for med to last.  2d ago started noticing left arm soreness as well as some numbness and also bilateral neck/upper shoulder tightness.  Sometimes with lightheadedness.  No other muscle stiffness/soreness.  Denies inciting trauma/injury.  No chest pain, sob, nausea, headaches, vision changes.  No other joint pains. Thinks is staying hydrated.  Review of Systems Per HPI    Objective:   Physical Exam  Nursing note and vitals reviewed. Constitutional: He is oriented to person, place, and time. He appears well-developed and well-nourished. No distress.  HENT:  Head: Normocephalic and atraumatic.  Mouth/Throat: Oropharynx is clear and moist. No oropharyngeal exudate.  Eyes: Conjunctivae and EOM are normal. Pupils are equal, round, and reactive to light. No scleral icterus.  Neck: Normal range of motion. Neck supple.  Musculoskeletal:       Mildly tender swelling left posterior neck.   No significant trap muscle tenderness FROM at neck.  No midline cervical spine tenderness.  Neurological: He is alert and oriented to person, place, and time.       5/5 strength BUE, sensation intact  Skin: Skin is warm and dry. No rash noted.  Psychiatric: He has a normal mood and affect.       Assessment & Plan:

## 2012-05-17 NOTE — Telephone Encounter (Signed)
Noted, will see then.

## 2012-05-17 NOTE — Patient Instructions (Signed)
I think this may be muscle spasm/tightness going on - treat with muscle relaxant (flexeril) and ibuprofen 800mg  - may also use ice/heat. Refilled pravastatin. We will see how you're doing next week.

## 2012-05-17 NOTE — Telephone Encounter (Signed)
Caller: Peggy/Spouse; PCP: Eustaquio Boyden; CB#: [phone]; Call regarding Lightheaded and Body Aches; Onset 05/17/12.  Afebrile.  Has appt for physical 05/22/12;  Started taking Pravstatin qod X 1 week to make RX last until appt.  Concerned < of Pravstatin may have caused symptoms.  Advised to see MD within 24 hrs for symptoms worsen with movement of head or looking up and not previously evaluated per Dizziness or Vertigo Guideline. Appt scheduled for 1545 05/17/12 with Dr Sharen Hones.

## 2012-05-17 NOTE — Assessment & Plan Note (Signed)
Anticipate left splenius mm spasm - treat with muscle relaxant, NSAID, and ice/heat.  Could have some nerve compression leading to paresthesias of left medial upper arm. Doubt related to statin. Alternatively posterior cervical LAD?  If not improving consider further eval and imaging.

## 2012-05-22 ENCOUNTER — Ambulatory Visit (INDEPENDENT_AMBULATORY_CARE_PROVIDER_SITE_OTHER): Payer: 59 | Admitting: Family Medicine

## 2012-05-22 ENCOUNTER — Encounter: Payer: Self-pay | Admitting: Family Medicine

## 2012-05-22 VITALS — BP 128/84 | HR 79 | Temp 98.1°F | Wt 167.0 lb

## 2012-05-22 DIAGNOSIS — Z860100 Personal history of colon polyps, unspecified: Secondary | ICD-10-CM

## 2012-05-22 DIAGNOSIS — K5790 Diverticulosis of intestine, part unspecified, without perforation or abscess without bleeding: Secondary | ICD-10-CM

## 2012-05-22 DIAGNOSIS — K5730 Diverticulosis of large intestine without perforation or abscess without bleeding: Secondary | ICD-10-CM

## 2012-05-22 DIAGNOSIS — M542 Cervicalgia: Secondary | ICD-10-CM

## 2012-05-22 DIAGNOSIS — Z0000 Encounter for general adult medical examination without abnormal findings: Secondary | ICD-10-CM

## 2012-05-22 DIAGNOSIS — E7800 Pure hypercholesterolemia, unspecified: Secondary | ICD-10-CM

## 2012-05-22 NOTE — Progress Notes (Signed)
Subjective:    Patient ID: Bryan Barker, male    DOB: Nov 07, 1951, 61 y.o.   MRN: [id number]  HPI CC: CPE  Seen last week with L neck pain, started after DECREASING pravastatin.  anticipated left splenius mm spasm - treated with muscle relaxant, NSAID, and ice/heat. Doubted related to statin.  Presents today for CPE.  No questions or concerns for me today.  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 Prostate screening - PSA doubled.  Gets yearly checks.  No nocturia. Strong stream. Flu shot yearly  Tetanus 2006 Shingles shot - had shingles once before.  Interested in this.  Medications and allergies reviewed and updated in chart.  Past histories reviewed and updated if relevant as below. Patient Active Problem List  Diagnosis  . HERPES ZOSTER  . ROTATOR CUFF INJURY, LEFT SHOULDER  . Hypercholesteremia  . Personal history of colonic polyps  . Diverticulosis  . Healthcare maintenance  . Neck pain on left side   Past Medical History  Diagnosis Date  . Dehydration 03/2011    ER for syncope attributed to dehydration  . Hyperlipidemia   . Diverticulosis     by colonoscopy   Past Surgical History  Procedure Date  . Mri disc herniated 07/99    right L4-5 with mild nerve root compression (Murphy/Wainer)   . 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 Smoking status: Former Smoker -- 1.0 packs/day for 3 years    Quit date: 06/16/1968  . Smokeless tobacco: Never Used   Comment: only smoked as a teenager  . Alcohol Use: No   Family History  Problem Relation Age of Onset  . Diabetes Mother   . Hypertension Mother   . Heart disease Mother    Pacer  . Colon polyps Mother   . Coronary artery disease Father 9    MI  . Cancer Brother     esoph, mets to brain  . Diabetes Brother   . Alcohol abuse Sister   . Colon cancer Neg Hx   . Stroke Neg Hx    No Known Allergies Current Outpatient Prescriptions on File Prior to Visit  Medication Sig Dispense Refill  . aspirin 81 MG tablet Take 81 mg by mouth daily.      . cyclobenzaprine (FLEXERIL) 10 MG tablet Take 1 tablet (10 mg total) by mouth 2 (two) times daily as needed for muscle spasms (sedation precautions).  30 tablet  0  . ibuprofen (ADVIL,MOTRIN) 800 MG tablet Take 1 tablet (800 mg total) by mouth every 8 (eight) hours as needed for pain.  30 tablet  0  . Omega-3 Fatty Acids (FISH OIL) 1000 MG CAPS Take by mouth daily.        . pravastatin (PRAVACHOL) 40 MG tablet Take 1 tablet (40 mg total) by mouth every evening.  30 tablet  11     Review of Systems  Constitutional: Negative for fever, chills, activity change, appetite change, fatigue and unexpected weight change.  HENT: Negative for hearing loss and neck pain.   Eyes: Negative for visual disturbance.  Respiratory: Negative for cough, chest tightness, shortness of breath and wheezing.   Cardiovascular: Negative for chest pain, palpitations and  leg swelling.  Gastrointestinal: Negative for nausea, vomiting, abdominal pain, diarrhea, constipation, blood in stool and abdominal distention.  Genitourinary: Negative for hematuria and difficulty urinating.  Musculoskeletal: Negative for myalgias and arthralgias.  Skin: Negative for rash.  Neurological: Negative for dizziness, seizures, syncope and headaches.  Hematological: Does not bruise/bleed easily.  Psychiatric/Behavioral: Negative for dysphoric mood. The patient is not nervous/anxious.        Objective:   Physical Exam  Nursing note and vitals reviewed. Constitutional: He is oriented to person, place, and time. He appears well-developed and well-nourished. No  distress.  HENT:  Head: Normocephalic and atraumatic.  Right Ear: External ear normal.  Left Ear: External ear normal.  Nose: Nose normal.  Mouth/Throat: Oropharynx is clear and moist. No oropharyngeal exudate.  Eyes: Conjunctivae and EOM are normal. Pupils are equal, round, and reactive to light. No scleral icterus.  Neck: Normal range of motion. Neck supple. No thyromegaly present.  Cardiovascular: Normal rate, regular rhythm, normal heart sounds and intact distal pulses.   No murmur heard. Pulses:      Radial pulses are 2+ on the right side, and 2+ on the left side.  Pulmonary/Chest: Effort normal and breath sounds normal. No respiratory distress. He has no wheezes. He has no rales.  Abdominal: Soft. Bowel sounds are normal. He exhibits no distension and no mass. There is no tenderness. There is no rebound and no guarding.  Genitourinary: Prostate normal. Rectal exam shows external hemorrhoid (noninflamed). Rectal exam shows no internal hemorrhoid, no fissure, no mass, no tenderness and anal tone normal. Prostate is not enlarged (20-25gm) and not tender.  Musculoskeletal: Normal range of motion. He exhibits no edema.  Lymphadenopathy:    He has no cervical adenopathy.  Neurological: He is alert and oriented to person, place, and time.       CN grossly intact, station and gait intact  Skin: Skin is warm and dry. No rash noted.  Psychiatric: He has a normal mood and affect. His behavior is normal. Judgment and thought content normal.       Assessment & Plan:

## 2012-05-22 NOTE — Assessment & Plan Note (Signed)
Spasm resolving, no longer with L post neck swelling

## 2012-05-22 NOTE — Assessment & Plan Note (Signed)
Chronic, stable. Reviewed #s with pt, adequate control, continue pravastatin.

## 2012-05-22 NOTE — Assessment & Plan Note (Signed)
Rpt colonoscopy 3-5 yrs.

## 2012-05-22 NOTE — Assessment & Plan Note (Signed)
preventative protocols reviewed and updated unless pt declined. Discussed heatlhy diet/lifestyle Reviewed blood work in detail.

## 2012-05-22 NOTE — Patient Instructions (Signed)
Call your insurace about the shingles shot to see if it is covered or how much it would cost and where is cheaper (here or pharmacy).  If you want to receive here, call for nurse visit. Return as needed or in 1 year for next physical. Good to see you today, call us with quesitons.

## 2012-05-22 NOTE — Assessment & Plan Note (Signed)
Discussed is to be seen for persistent LLQ pain.

## 2012-06-28 ENCOUNTER — Ambulatory Visit (INDEPENDENT_AMBULATORY_CARE_PROVIDER_SITE_OTHER): Payer: 59

## 2012-06-28 DIAGNOSIS — Z23 Encounter for immunization: Secondary | ICD-10-CM

## 2012-06-28 DIAGNOSIS — Z2911 Encounter for prophylactic immunotherapy for respiratory syncytial virus (RSV): Secondary | ICD-10-CM

## 2012-08-21 ENCOUNTER — Ambulatory Visit (INDEPENDENT_AMBULATORY_CARE_PROVIDER_SITE_OTHER): Payer: 59

## 2012-08-21 DIAGNOSIS — Z23 Encounter for immunization: Secondary | ICD-10-CM

## 2013-05-01 IMAGING — CT CT HEAD W/O CM
1 series · 16 of 30 positions shown, 20 images · non-contrast
Comparison: None.

CLINICAL DATA: Syncope.  Vomiting.

CT HEAD WITHOUT CONTRAST
TECHNIQUE: Contiguous axial images were obtained from the base of
the skull through the vertex without contrast.

[Series 2: head routine 4.8 h37s · axial · 0.46mm/px · z∈[-172,-19]mm · 16 of 36 slices shown, 20 images]
[im 2/36  brain]
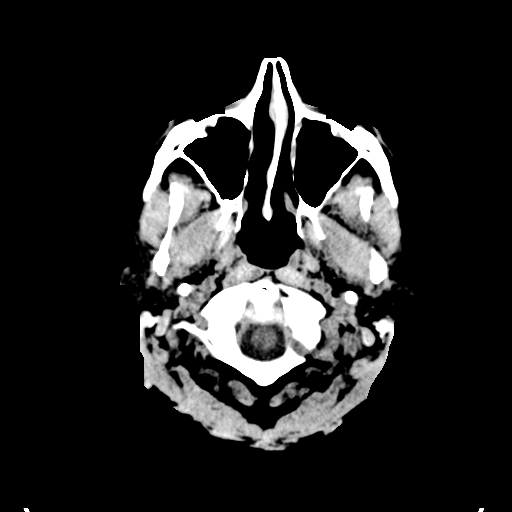
[im 2/36  bone]
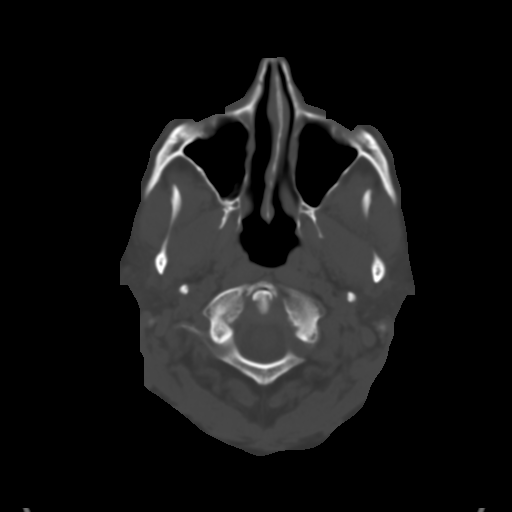
[im 4/36  brain]
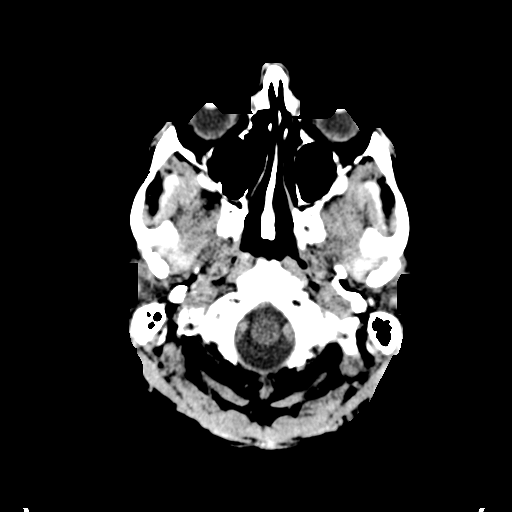
[im 7/36  brain]
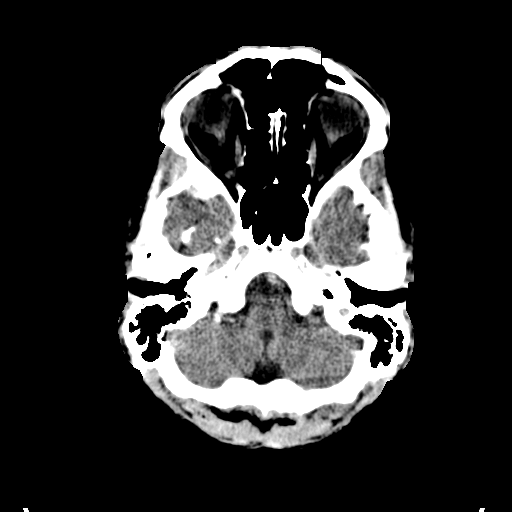
[im 9/36  brain]
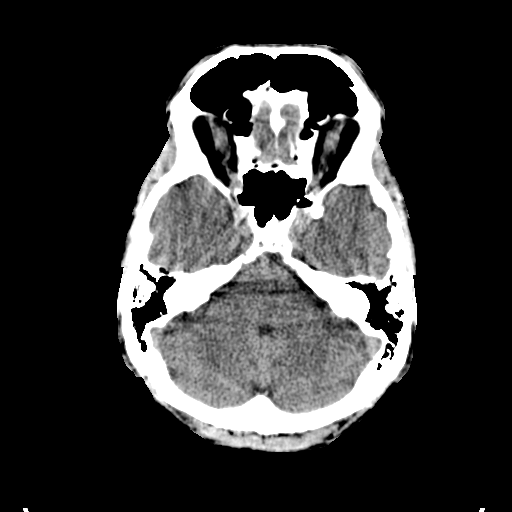
[im 10/36  brain]
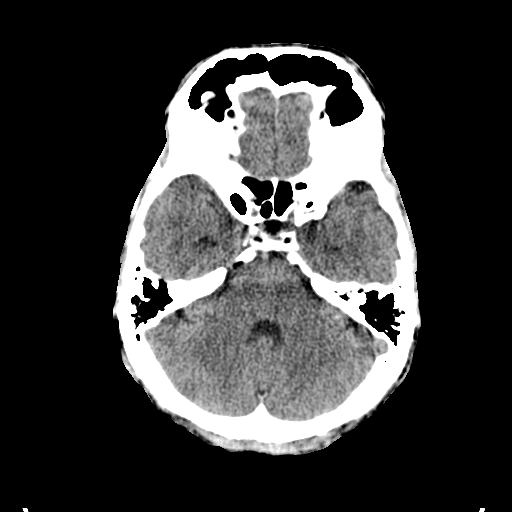
[im 10/36  bone]
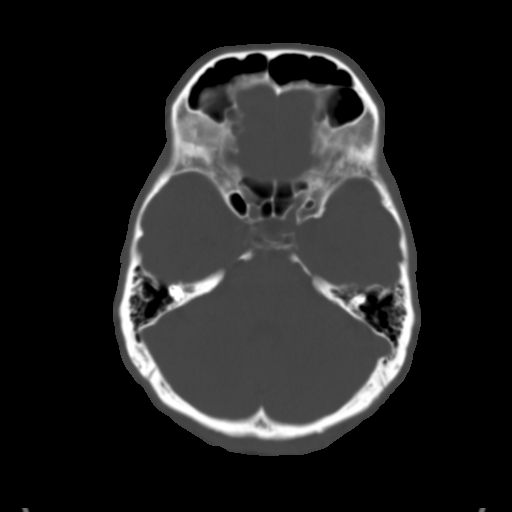
[im 13/36  brain]
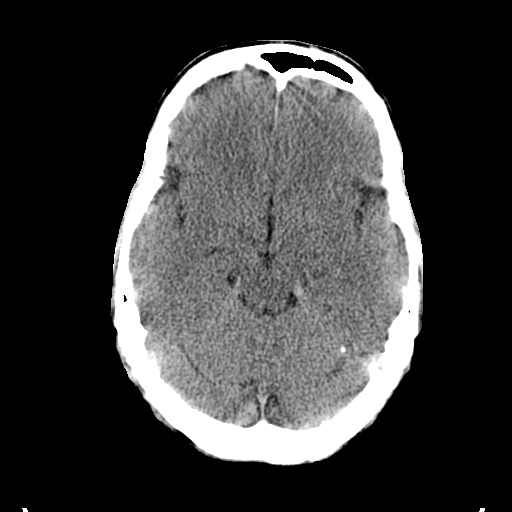
[im 15/36  brain]
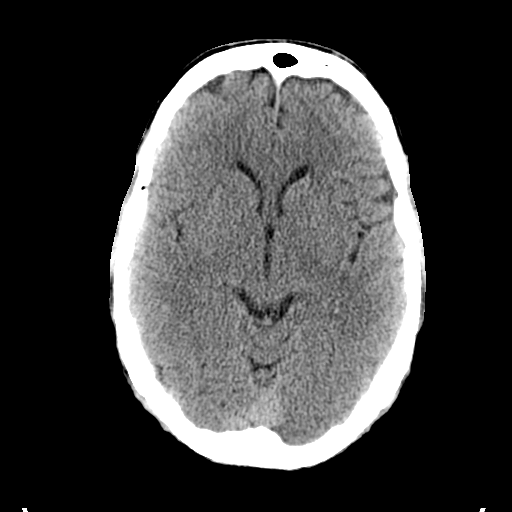
[im 17/36  brain]
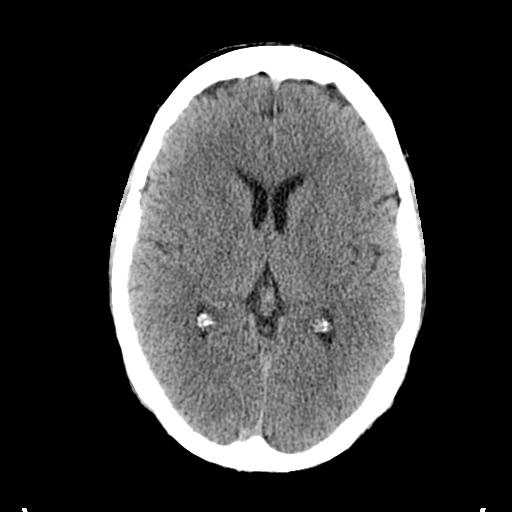
[im 19/36  brain]
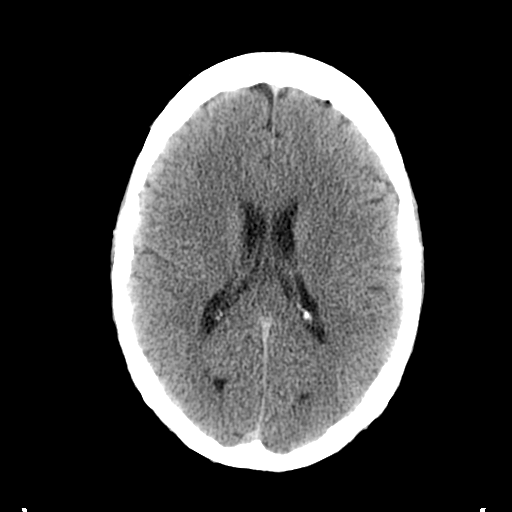
[im 19/36  bone]
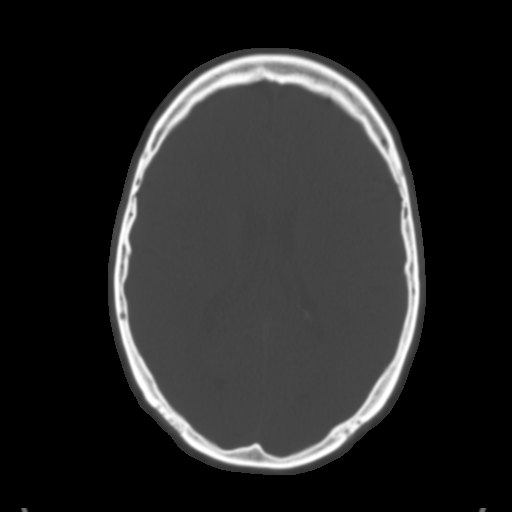
[im 21/36  brain]
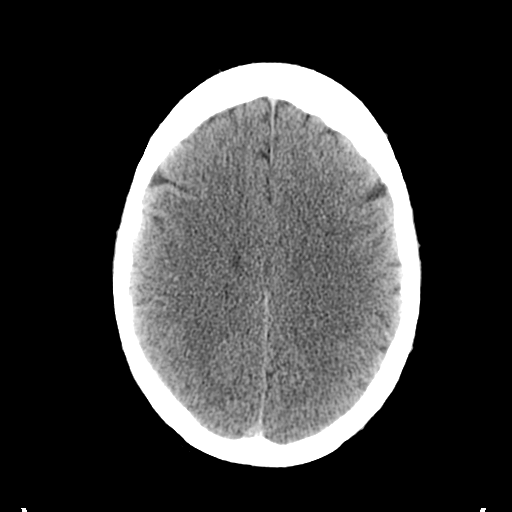
[im 23/36  brain]
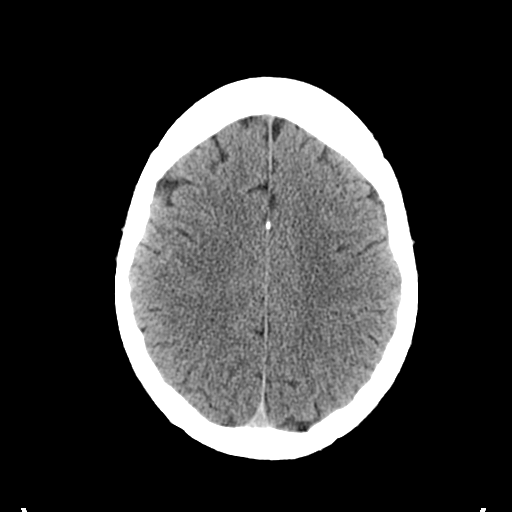
[im 26/36  brain]
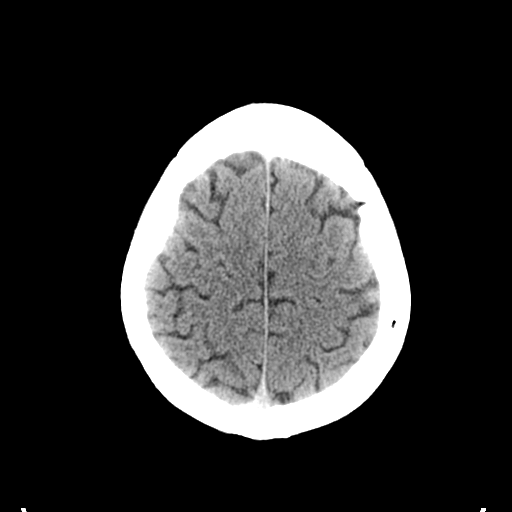
[im 27/36  brain]
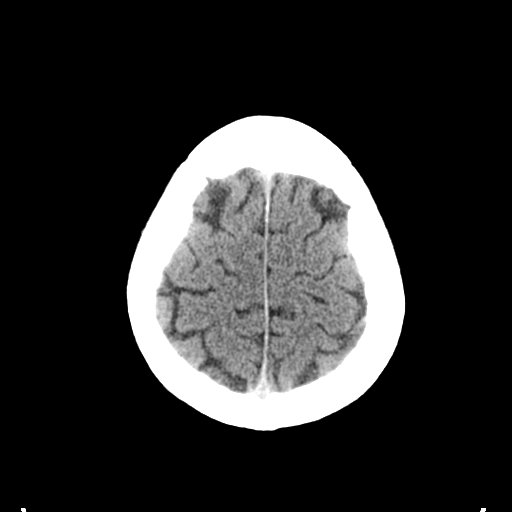
[im 27/36  bone]
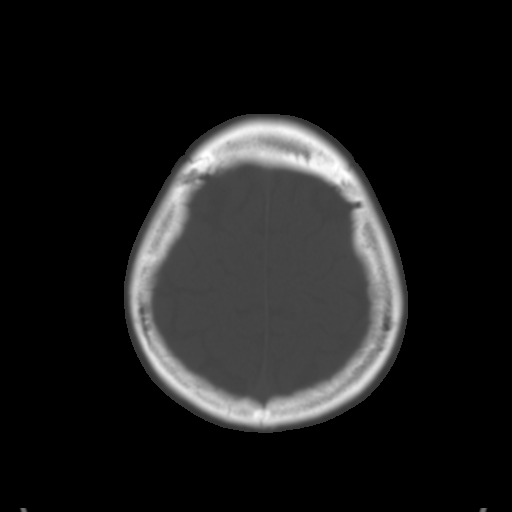
[im 29/36  brain]
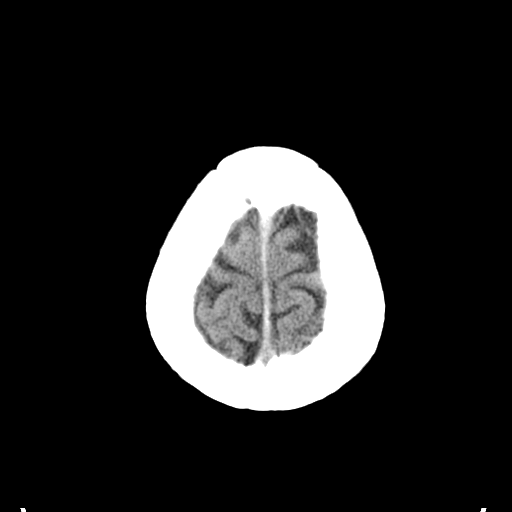
[im 32/36  brain]
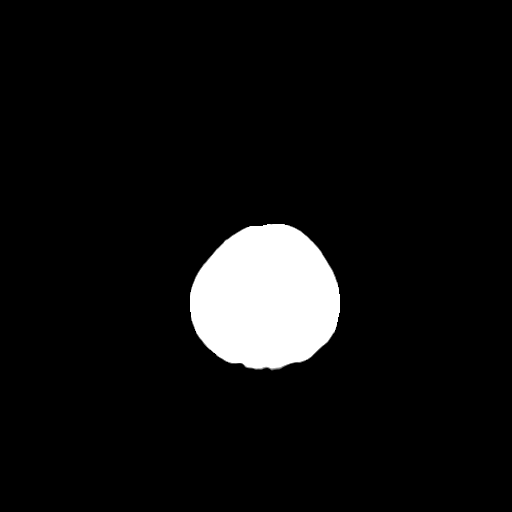
[im 34/36  brain]
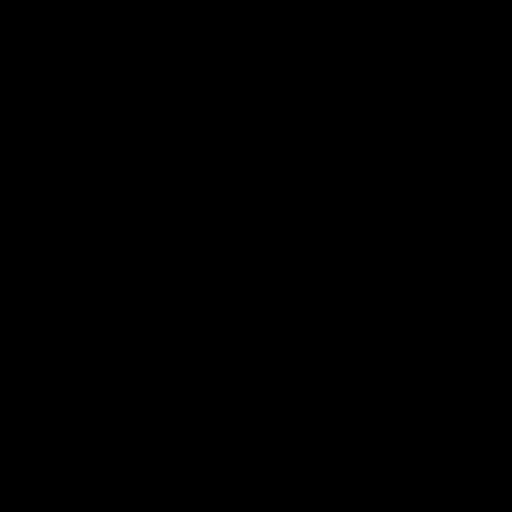

[16 of 30 positions shown; findings below may reference images not displayed]

FINDINGS: Normal appearing cerebral hemispheres and posterior fossa
structures.  Normal size and position of the ventricles.  No
intracranial hemorrhage, mass lesion or evidence of acute
infarction.  Unremarkable bones and included portions of the
paranasal sinuses.
IMPRESSION: Normal examination.

## 2013-05-12 ENCOUNTER — Other Ambulatory Visit: Payer: Self-pay | Admitting: Family Medicine

## 2013-05-12 DIAGNOSIS — Z125 Encounter for screening for malignant neoplasm of prostate: Secondary | ICD-10-CM

## 2013-05-12 DIAGNOSIS — E7800 Pure hypercholesterolemia, unspecified: Secondary | ICD-10-CM

## 2013-05-15 ENCOUNTER — Other Ambulatory Visit (INDEPENDENT_AMBULATORY_CARE_PROVIDER_SITE_OTHER): Payer: 59

## 2013-05-15 DIAGNOSIS — Z125 Encounter for screening for malignant neoplasm of prostate: Secondary | ICD-10-CM

## 2013-05-15 DIAGNOSIS — E7800 Pure hypercholesterolemia, unspecified: Secondary | ICD-10-CM

## 2013-05-15 LAB — BASIC METABOLIC PANEL
BUN: 12 mg/dL (ref 6–23)
Calcium: 9.6 mg/dL (ref 8.4–10.5)
Creatinine, Ser: 1.1 mg/dL (ref 0.4–1.5)
GFR: 73.14 mL/min (ref >60.00)

## 2013-05-15 LAB — LIPID PANEL
Cholesterol: 152 mg/dL (ref 0–200)
LDL Cholesterol: 93 mg/dL (ref 0–99)
Triglycerides: 129 mg/dL (ref 0.0–149.0)
VLDL: 25.8 mg/dL (ref 0.0–40.0)

## 2013-05-15 LAB — PSA: PSA: 1.34 ng/mL (ref 0.10–4.00)

## 2013-05-22 ENCOUNTER — Encounter: Payer: Self-pay | Admitting: Family Medicine

## 2013-05-22 ENCOUNTER — Ambulatory Visit (INDEPENDENT_AMBULATORY_CARE_PROVIDER_SITE_OTHER): Payer: 59 | Admitting: Family Medicine

## 2013-05-22 ENCOUNTER — Other Ambulatory Visit: Payer: Self-pay | Admitting: *Deleted

## 2013-05-22 VITALS — BP 108/60 | HR 72 | Temp 97.9°F | Ht 68.0 in | Wt 163.5 lb

## 2013-05-22 DIAGNOSIS — E7800 Pure hypercholesterolemia, unspecified: Secondary | ICD-10-CM

## 2013-05-22 DIAGNOSIS — Z860100 Personal history of colon polyps, unspecified: Secondary | ICD-10-CM

## 2013-05-22 DIAGNOSIS — Z0000 Encounter for general adult medical examination without abnormal findings: Secondary | ICD-10-CM

## 2013-05-22 MED ORDER — PRAVASTATIN SODIUM 40 MG PO TABS: 40.0000 mg | ORAL_TABLET | Freq: Every evening | ORAL | Status: DC

## 2013-05-22 NOTE — Patient Instructions (Signed)
Return as needed or in year for next physical. Good to see you today, call us with questions. You are doing well today.

## 2013-05-22 NOTE — Assessment & Plan Note (Signed)
Chronic, stable.  Tolerating pravastatin. Reviewed #s with patient, discussed importance of aerobic exercise to help raise HDL.

## 2013-05-22 NOTE — Addendum Note (Signed)
Addended by: Eustaquio Boyden on: 05/22/2013 08:56 AM   Modules accepted: Orders

## 2013-05-22 NOTE — Assessment & Plan Note (Signed)
Plant rpt colonoscopy in 3 years (06/2014)

## 2013-05-22 NOTE — Assessment & Plan Note (Signed)
Preventative protocols reviewed and updated unless pt declined. Discussed healthy diet and lifestyle. Reviewed blood work in detail. 

## 2013-05-22 NOTE — Progress Notes (Signed)
Subjective:    Patient ID: Bryan Barker, male    DOB: 26-Apr-1951, 62 y.o.   MRN: [id number]  HPI CC: CPE  No question or concerns today. Tolerating pravastatin.  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 Diet: good water, fruits/vegetables daily.  Seat belt use discussed. Sunscreen use - doesn't use.  Denies changing moles.  Preventative:  Colonoscopy - 06/2011 - 4 polyps, rec rpt 3 yrs Leone Payor).  Prostate screening - PSA has decreased again. Gets yearly checks. No nocturia. Strong stream.  Flu shot yearly  Tetanus 2006  Shingles shot - 06/2012  Medications and allergies reviewed and updated in chart.  Past histories reviewed and updated if relevant as below. Patient Active Problem List   Diagnosis Date Noted  . Neck pain on left side 05/17/2012  . Healthcare maintenance 01/31/2012  . Diverticulosis 07/23/2011  . Personal history of colonic polyps 07/06/2011  . Hypercholesteremia 05/19/2011  . ROTATOR CUFF INJURY, LEFT SHOULDER 10/21/2009  . HERPES ZOSTER 03/06/2007   Past Medical History  Diagnosis Date  . Dehydration 03/2011    ER for syncope attributed to dehydration  . Hyperlipidemia   . Diverticulosis     by colonoscopy   Past Surgical History  Procedure Laterality Date  . Mri disc herniated  07/99    right L4-5 with mild nerve root compression (Murphy/Wainer)   . 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 Smoking status: Former Smoker -- 1.00 packs/day for 3 years    Quit date: 06/16/1968  . Smokeless tobacco: Never Used     Comment: only  smoked as a teenager  . Alcohol Use: No   Family History  Problem Relation Age of Onset  . Diabetes Mother   . Hypertension Mother   . Heart disease Mother     Pacer  . Colon polyps Mother   . Coronary artery disease Father 19    MI  . Cancer Brother     esoph, mets to brain  . Diabetes Brother   . Alcohol abuse Sister   . Colon cancer Neg Hx   . Stroke Neg Hx    No Known Allergies Current Outpatient Prescriptions on File Prior to Visit  Medication Sig Dispense Refill  . aspirin 81 MG tablet Take 81 mg by mouth daily.      . Omega-3 Fatty Acids (FISH OIL) 1000 MG CAPS Take by mouth daily.        . pravastatin (PRAVACHOL) 40 MG tablet Take 1 tablet (40 mg total) by mouth every evening.  30 tablet  11   No current facility-administered medications on file prior to visit.     Review of Systems  Constitutional: Negative for fever, chills, activity change, appetite change, fatigue and unexpected weight change.  HENT: Negative for hearing loss and neck pain.   Eyes: Negative for visual disturbance.  Respiratory: Negative for cough, chest tightness, shortness of breath and wheezing.   Cardiovascular: Negative for chest pain, palpitations and leg swelling.  Gastrointestinal: Negative for nausea, vomiting, abdominal pain, diarrhea, constipation, blood  in stool and abdominal distention.  Genitourinary: Negative for urgency, hematuria, decreased urine volume and difficulty urinating.       No nocturia, stream well  Musculoskeletal: Negative for myalgias and arthralgias.  Skin: Negative for rash.  Neurological: Negative for dizziness, seizures, syncope and headaches.  Hematological: Negative for adenopathy. Does not bruise/bleed easily.  Psychiatric/Behavioral: Negative for dysphoric mood. The patient is not nervous/anxious.        Objective:   Physical Exam  Nursing note and vitals reviewed. Constitutional: He is oriented to person, place, and time. He appears well-developed and  well-nourished. No distress.  HENT:  Head: Normocephalic and atraumatic.  Right Ear: Hearing, tympanic membrane, external ear and ear canal normal.  Left Ear: Hearing, tympanic membrane, external ear and ear canal normal.  Nose: Nose normal.  Mouth/Throat: Oropharynx is clear and moist. No oropharyngeal exudate.  Eyes: Conjunctivae and EOM are normal. Pupils are equal, round, and reactive to light. No scleral icterus.  Neck: Normal range of motion. Neck supple. Carotid bruit is not present. No thyromegaly present.  Cardiovascular: Normal rate, regular rhythm, normal heart sounds and intact distal pulses.   No murmur heard. Pulses:      Radial pulses are 2+ on the right side, and 2+ on the left side.  Pulmonary/Chest: Effort normal and breath sounds normal. No respiratory distress. He has no wheezes. He has no rales.  Abdominal: Soft. Bowel sounds are normal. He exhibits no distension and no mass. There is no tenderness. There is no rebound and no guarding.  Genitourinary: Prostate normal. Rectal exam shows external hemorrhoid. Rectal exam shows no internal hemorrhoid, no fissure, no mass, no tenderness and anal tone normal. Prostate is not enlarged (~20gm) and not tender.  Musculoskeletal: Normal range of motion. He exhibits no edema.  Lymphadenopathy:    He has no cervical adenopathy.  Neurological: He is alert and oriented to person, place, and time.  CN grossly intact, station and gait intact  Skin: Skin is warm and dry. No rash noted.  Psychiatric: He has a normal mood and affect. His behavior is normal. Judgment and thought content normal.       Assessment & Plan:

## 2013-08-20 ENCOUNTER — Ambulatory Visit (INDEPENDENT_AMBULATORY_CARE_PROVIDER_SITE_OTHER): Payer: 59

## 2013-08-20 DIAGNOSIS — Z23 Encounter for immunization: Secondary | ICD-10-CM

## 2013-10-30 ENCOUNTER — Encounter: Payer: Self-pay | Admitting: Family Medicine

## 2013-10-30 ENCOUNTER — Ambulatory Visit (INDEPENDENT_AMBULATORY_CARE_PROVIDER_SITE_OTHER): Payer: 59 | Admitting: Family Medicine

## 2013-10-30 VITALS — BP 128/82 | HR 96 | Temp 98.7°F | Wt 163.8 lb

## 2013-10-30 DIAGNOSIS — J019 Acute sinusitis, unspecified: Secondary | ICD-10-CM

## 2013-10-30 MED ORDER — GUAIFENESIN-CODEINE 100-10 MG/5ML PO SYRP: 5.0000 mL | ORAL_SOLUTION | Freq: Two times a day (BID) | ORAL | Status: DC | PRN

## 2013-10-30 MED ORDER — AMOXICILLIN-POT CLAVULANATE 875-125 MG PO TABS: 1.0000 | ORAL_TABLET | Freq: Two times a day (BID) | ORAL | Status: AC

## 2013-10-30 NOTE — Patient Instructions (Signed)
You have a sinus infection. Take medicine as prescribed: augmentin twice daily for 10 days.  May use cheratussin cough syrup at night time. Push fluids and plenty of rest. Nasal saline irrigation or neti pot to help drain sinuses. May use plain mucinex with plenty of fluid to help mobilize mucous. Let us know if fever >101.5, trouble opening/closing mouth, difficulty swallowing, or worsening - you may need to be seen again.

## 2013-10-30 NOTE — Assessment & Plan Note (Signed)
anticipate bacterial cause given duration of sxs. Treat with augmentin and cheratussin for cough.  red flags to return or seek care discussed. Pt agrees with plan.

## 2013-10-30 NOTE — Progress Notes (Signed)
Pre-visit discussion using our clinic review tool. No additional management support is needed unless otherwise documented below in the visit note.  

## 2013-10-30 NOTE — Progress Notes (Signed)
   Subjective:    Patient ID: Bryan Barker, male    DOB: Oct 29, 1951, 62 y.o.   MRN: [id number]  HPI CC: sinus congestion  8 d h/o sinus congestion, green mucous worse in morning.  Ears clogged.  + coughing productive of mucous in morning - has awakened at 2am 2/2 congestion.  Feverish. Some tooth discomfort and ear discomfort.  + HA.  No abd pain, nausea.  Has tried OTC meds inlcuding alka seltzer cold and sinus.  Has also tried delsym and mucinex sxs not improving. + sick contacts at home. No asthma hx, smoking, COPD.  Past Medical History  Diagnosis Date  . Dehydration 03/2011    ER for syncope attributed to dehydration  . Hyperlipidemia   . Diverticulosis     by colonoscopy     Review of Systems Per HPI    Objective:   Physical Exam  Nursing note and vitals reviewed. Constitutional: He appears well-developed and well-nourished. No distress.  HENT:  Head: Normocephalic and atraumatic.  Right Ear: Hearing, tympanic membrane, external ear and ear canal normal.  Left Ear: Hearing, tympanic membrane, external ear and ear canal normal.  Nose: Mucosal edema present. No rhinorrhea. Right sinus exhibits no maxillary sinus tenderness and no frontal sinus tenderness. Left sinus exhibits no maxillary sinus tenderness and no frontal sinus tenderness.  Mouth/Throat: Uvula is midline, oropharynx is clear and moist and mucous membranes are normal. No oropharyngeal exudate, posterior oropharyngeal edema, posterior oropharyngeal erythema or tonsillar abscesses.  Eyes: Conjunctivae and EOM are normal. Pupils are equal, round, and reactive to light. No scleral icterus.  Neck: Normal range of motion. Neck supple.  Cardiovascular: Normal rate, regular rhythm, normal heart sounds and intact distal pulses.   No murmur heard. Pulmonary/Chest: Effort normal. No respiratory distress. He has no decreased breath sounds. He has no wheezes. He has rhonchi (isolated L mid lung field, resolves with deep  breath). He has no rales.  Lymphadenopathy:    He has no cervical adenopathy.  Skin: Skin is warm and dry. No rash noted.       Assessment & Plan:

## 2014-05-13 ENCOUNTER — Other Ambulatory Visit: Payer: Self-pay | Admitting: Family Medicine

## 2014-05-13 DIAGNOSIS — E785 Hyperlipidemia, unspecified: Secondary | ICD-10-CM

## 2014-05-19 ENCOUNTER — Encounter: Payer: Self-pay | Admitting: Family Medicine

## 2014-05-19 ENCOUNTER — Ambulatory Visit (INDEPENDENT_AMBULATORY_CARE_PROVIDER_SITE_OTHER): Payer: Medicare Other | Admitting: Family Medicine

## 2014-05-19 VITALS — BP 110/60 | HR 68 | Temp 97.9°F | Wt 165.5 lb

## 2014-05-19 DIAGNOSIS — E7800 Pure hypercholesterolemia, unspecified: Secondary | ICD-10-CM

## 2014-05-19 DIAGNOSIS — Z86010 Personal history of colonic polyps: Secondary | ICD-10-CM

## 2014-05-19 MED ORDER — PRAVASTATIN SODIUM 40 MG PO TABS: 40.0000 mg | ORAL_TABLET | Freq: Every day | ORAL | Status: DC

## 2014-05-19 NOTE — Assessment & Plan Note (Signed)
May be due this year - will discuss at upcoming appt.

## 2014-05-19 NOTE — Progress Notes (Signed)
   BP 110/60  Pulse 68  Temp(Src) 97.9 F (36.6 C) (Oral)  Wt 165 lb 8 oz (75.07 kg)   CC: med refill visit  Subjective:    Patient ID: Bryan Barker, male    DOB: Sep 13, 1951, 63 y.o.   MRN: [id number]  HPI: Bryan Barker is a 63 y.o. male presenting on 05/19/2014 for Medication Refill   HLD - tolerating pravastatin well. No myalgias. LDL last checked 1 year ago 90s.  Relevant past medical, surgical, family and social history reviewed and updated as indicated.  Allergies and medications reviewed and updated. Current Outpatient Prescriptions on File Prior to Visit  Medication Sig  . aspirin 81 MG tablet Take 81 mg by mouth daily.  . Omega-3 Fatty Acids (FISH OIL) 1000 MG CAPS Take by mouth daily.     No current facility-administered medications on file prior to visit.    Review of Systems Per HPI unless specifically indicated above    Objective:    BP 110/60  Pulse 68  Temp(Src) 97.9 F (36.6 C) (Oral)  Wt 165 lb 8 oz (75.07 kg)  Physical Exam  Nursing note and vitals reviewed. Constitutional: He appears well-developed and well-nourished. No distress.  HENT:  Mouth/Throat: Oropharynx is clear and moist. No oropharyngeal exudate.  Cardiovascular: Normal rate, regular rhythm, normal heart sounds and intact distal pulses.   No murmur heard. Pulmonary/Chest: Effort normal and breath sounds normal. No respiratory distress. He has no wheezes. He has no rales.  Musculoskeletal: He exhibits no edema.       Assessment & Plan:  I have asked patient to return in next few months for welcome to medicare visit.  Problem List Items Addressed This Visit   Personal history of colonic polyps     May be due this year - will discuss at upcoming appt.    Hypercholesteremia - Primary     Chronic, stable. Continue pravastatin. Refilled today.    Relevant Medications      pravastatin (PRAVACHOL) tablet       Follow up plan: Return in about 3 months (around 08/19/2014), or as  needed, for welcome to medicare visit.

## 2014-05-19 NOTE — Progress Notes (Signed)
Pre visit review using our clinic review tool, if applicable. No additional management support is needed unless otherwise documented below in the visit note. 

## 2014-05-19 NOTE — Patient Instructions (Signed)
Good to see you today I've refilled your pravastatin. Return at your convenience for fasting labwork and afterwards for welcome to medicare visit.

## 2014-05-19 NOTE — Assessment & Plan Note (Signed)
Chronic, stable. Continue pravastatin. Refilled today.

## 2014-07-21 ENCOUNTER — Encounter: Payer: Self-pay | Admitting: Internal Medicine

## 2014-08-13 ENCOUNTER — Other Ambulatory Visit: Payer: Self-pay | Admitting: Family Medicine

## 2014-08-13 DIAGNOSIS — Z125 Encounter for screening for malignant neoplasm of prostate: Secondary | ICD-10-CM

## 2014-08-13 DIAGNOSIS — E7800 Pure hypercholesterolemia, unspecified: Secondary | ICD-10-CM

## 2014-08-14 ENCOUNTER — Other Ambulatory Visit (INDEPENDENT_AMBULATORY_CARE_PROVIDER_SITE_OTHER): Payer: Medicare Other

## 2014-08-14 DIAGNOSIS — Z125 Encounter for screening for malignant neoplasm of prostate: Secondary | ICD-10-CM

## 2014-08-14 DIAGNOSIS — Z0000 Encounter for general adult medical examination without abnormal findings: Secondary | ICD-10-CM

## 2014-08-14 DIAGNOSIS — E7800 Pure hypercholesterolemia, unspecified: Secondary | ICD-10-CM

## 2014-08-14 LAB — BASIC METABOLIC PANEL
BUN: 13 mg/dL (ref 6–23)
CO2: 31 mEq/L (ref 19–32)
Calcium: 9.4 mg/dL (ref 8.4–10.5)
Chloride: 103 mEq/L (ref 96–112)
Creatinine, Ser: 1 mg/dL (ref 0.4–1.5)
GFR: 82.47 mL/min (ref >60.00)
Glucose, Bld: 97 mg/dL (ref 70–99)
Potassium: 4.4 mEq/L (ref 3.5–5.1)
SODIUM: 139 meq/L (ref 135–145)

## 2014-08-14 LAB — LIPID PANEL
CHOL/HDL RATIO: 5
Cholesterol: 158 mg/dL (ref 0–200)
HDL: 29.3 mg/dL — ABNORMAL LOW (ref >39.00)
LDL Cholesterol: 102 mg/dL — ABNORMAL HIGH (ref 0–99)
NONHDL: 128.7
Triglycerides: 133 mg/dL (ref 0.0–149.0)
VLDL: 26.6 mg/dL (ref 0.0–40.0)

## 2014-08-14 LAB — PSA: PSA: 1.18 ng/mL (ref 0.10–4.00)

## 2014-08-21 ENCOUNTER — Encounter: Payer: Self-pay | Admitting: Family Medicine

## 2014-08-21 ENCOUNTER — Ambulatory Visit (INDEPENDENT_AMBULATORY_CARE_PROVIDER_SITE_OTHER): Payer: Medicare Other | Admitting: Family Medicine

## 2014-08-21 VITALS — BP 112/68 | HR 65 | Temp 97.9°F | Ht 67.5 in | Wt 161.5 lb

## 2014-08-21 DIAGNOSIS — Z23 Encounter for immunization: Secondary | ICD-10-CM

## 2014-08-21 DIAGNOSIS — Z860100 Personal history of colon polyps, unspecified: Secondary | ICD-10-CM

## 2014-08-21 DIAGNOSIS — E7800 Pure hypercholesterolemia, unspecified: Secondary | ICD-10-CM

## 2014-08-21 DIAGNOSIS — Z0000 Encounter for general adult medical examination without abnormal findings: Secondary | ICD-10-CM

## 2014-08-21 DIAGNOSIS — Z7189 Other specified counseling: Secondary | ICD-10-CM

## 2014-08-21 NOTE — Progress Notes (Signed)
Pre visit review using our clinic review tool, if applicable. No additional management support is needed unless otherwise documented below in the visit note. 

## 2014-08-21 NOTE — Assessment & Plan Note (Signed)
Advanced directives: packet provided today. Discussed. Would think wife or oldest daughter be HCPOA

## 2014-08-21 NOTE — Assessment & Plan Note (Addendum)
I have personally reviewed the Medicare Annual Wellness questionnaire and have noted 1. The patient's medical and social history 2. Their use of alcohol, tobacco or illicit drugs 3. Their current medications and supplements 4. The patient's functional ability including ADL's, fall risks, home safety risks and hearing or visual impairment. 5. Diet and physical activity 6. Evidence for depression or mood disorders The patients weight, height, BMI have been recorded in the chart.  Hearing and vision has been addressed. I have made referrals, counseling and provided education to the patient based review of the above and I have provided the pt with a written personalized care plan for preventive services. Provider list updated - see scanned questionairre.  Reviewed preventative protocols and updated unless pt declined. EKG for welcome to medicare visit done today. EKG - NSR rate 60, normal axis, intervals, no acute ST/T changes. PVCx1

## 2014-08-21 NOTE — Assessment & Plan Note (Signed)
Chronic, stable. Continue statin and fish oil. Discussed increased aerobic exercise to raise HDL.

## 2014-08-21 NOTE — Progress Notes (Signed)
BP 112/68  Pulse 65  Temp(Src) 97.9 F (36.6 C) (Oral)  Ht 5' 7.5" (1.715 m)  Wt 161 lb 8 oz (73.256 kg)  BMI 24.91 kg/m2  SpO2 98%   CC: welcome to medicare visit  Subjective:    Patient ID: Bryan Barker, male    DOB: Aug 04, 1951, 63 y.o.   MRN: [id number]  HPI: Bryan Barker is a 63 y.o. male presenting on 08/21/2014 for Annual Exam   Seat belt use discussed.  Sunscreen use - doesn't use. Denies changing moles.   Passes hearing and vision screens today Denies depression/anhedonia, falls.  Preventative: Colonoscopy - 06/2011 - 4 polyps, rec rpt 3 yrs Carlean Purl). Scheduled for 10/2014. Prostate screening - PSA has decreased again. Gets yearly checks. No nocturia. Strong stream.  Flu shot yearly  prevnar today. Tetanus 2006  Shingles shot - 06/2012 Advanced directives: packet provided today. Discussed. 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  Allergies and medications reviewed and updated. Current Outpatient Prescriptions on File Prior to Visit  Medication Sig  . aspirin 81 MG tablet Take 81 mg by mouth daily.  . Omega-3 Fatty Acids (FISH OIL) 1000 MG CAPS Take by mouth daily.    . pravastatin (PRAVACHOL) 40 MG tablet Take 1 tablet (40 mg total) by mouth daily.   No current facility-administered medications on file prior to visit.    Review of Systems  Constitutional: Negative for fever, chills, activity change, appetite change, fatigue and unexpected weight change.  HENT: Negative for hearing loss.   Eyes: Negative for visual disturbance.  Respiratory: Negative for cough, chest tightness,  shortness of breath and wheezing.   Cardiovascular: Negative for chest pain, palpitations and leg swelling.  Gastrointestinal: Negative for nausea, vomiting, abdominal pain, diarrhea, constipation, blood in stool and abdominal distention.  Genitourinary: Negative for hematuria and difficulty urinating.  Musculoskeletal: Negative for arthralgias, myalgias and neck pain.  Skin: Negative for rash.  Neurological: Negative for dizziness, seizures, syncope and headaches.  Hematological: Negative for adenopathy. Does not bruise/bleed easily.  Psychiatric/Behavioral: Negative for dysphoric mood. The patient is not nervous/anxious.    Per HPI unless specifically indicated above    Objective:    BP 112/68  Pulse 65  Temp(Src) 97.9 F (36.6 C) (Oral)  Ht 5' 7.5" (1.715 m)  Wt 161 lb 8 oz (73.256 kg)  BMI 24.91 kg/m2  SpO2 98%  Physical Exam  Nursing note and vitals reviewed. Constitutional: He is oriented to person, place, and time. He appears well-developed and well-nourished. No distress.  HENT:  Head: Normocephalic and atraumatic.  Right Ear: Hearing, tympanic membrane, external ear and ear canal normal.  Left Ear: Hearing, tympanic membrane, external ear and ear canal normal.  Nose: Nose normal.  Mouth/Throat: Uvula is midline, oropharynx is clear and moist and mucous membranes are normal. No oropharyngeal exudate, posterior oropharyngeal edema or posterior oropharyngeal erythema.  Eyes: Conjunctivae and EOM are normal. Pupils are equal, round, and reactive to light. No scleral icterus.  Neck: Normal range of motion. Neck supple. Carotid bruit is not present. No thyromegaly present.  Cardiovascular: Normal rate, regular rhythm, normal heart sounds and intact distal pulses.  No murmur heard. Pulses:      Radial pulses are 2+ on the right side, and 2+ on the left side.  Pulmonary/Chest: Effort normal and breath sounds normal. No respiratory distress. He has no wheezes. He has no rales.    Abdominal: Soft. Bowel sounds are normal. He exhibits no distension and no mass. There is no tenderness. There is no rebound and no guarding.  Genitourinary: Prostate normal. Rectal exam shows external hemorrhoid (non inflamed). Rectal exam shows no internal hemorrhoid, no fissure, no mass, no tenderness and anal tone normal. Prostate is not enlarged (15gm) and not tender.  Musculoskeletal: Normal range of motion. He exhibits no edema.  Lymphadenopathy:    He has no cervical adenopathy.  Neurological: He is alert and oriented to person, place, and time.  CN grossly intact, station and gait intact Recall 2/3, 3/3 with cue Calculation 3/5 serial 7s 5/5 [id number]  Skin: Skin is warm and dry. No rash noted.  Psychiatric: He has a normal mood and affect. His behavior is normal. Judgment and thought content normal.   Results for orders placed in visit on 08/14/14  LIPID PANEL      Result Value Ref Range   Cholesterol 158  0 - 200 mg/dL   Triglycerides 133.0  0.0 - 149.0 mg/dL   HDL 29.30 (*) >39.00 mg/dL   VLDL 26.6  0.0 - 40.0 mg/dL   LDL Cholesterol 102 (*) 0 - 99 mg/dL   Total CHOL/HDL Ratio 5     NonHDL 128.70    PSA      Result Value Ref Range   PSA 1.18  0.10 - 4.00 ng/mL  BASIC METABOLIC PANEL      Result Value Ref Range   Sodium 139  135 - 145 mEq/L   Potassium 4.4  3.5 - 5.1 mEq/L   Chloride 103  96 - 112 mEq/L   CO2 31  19 - 32 mEq/L   Glucose, Bld 97  70 - 99 mg/dL   BUN 13  6 - 23 mg/dL   Creatinine, Ser 1.0  0.4 - 1.5 mg/dL   Calcium 9.4  8.4 - 10.5 mg/dL   GFR 82.47  >60.00 mL/min      Assessment & Plan:   Problem List Items Addressed This Visit   Welcome to Medicare preventive visit - Primary     I have personally reviewed the Medicare Annual Wellness questionnaire and have noted 1. The patient's medical and social history 2. Their use of alcohol, tobacco or illicit drugs 3. Their current medications and supplements 4. The patient's functional ability  including ADL's, fall risks, home safety risks and hearing or visual impairment. 5. Diet and physical activity 6. Evidence for depression or mood disorders The patients weight, height, BMI have been recorded in the chart.  Hearing and vision has been addressed. I have made referrals, counseling and provided education to the patient based review of the above and I have provided the pt with a written personalized care plan for preventive services. Provider list updated - see scanned questionairre.  Reviewed preventative protocols and updated unless pt declined. EKG for welcome to medicare visit done today. EKG - NSR rate 60, normal axis, intervals, no acute ST/T changes. PVCx1    Relevant Orders      EKG 12-Lead (Completed)   Hypercholesteremia     Chronic, stable. Continue statin and fish oil. Discussed increased aerobic exercise to raise HDL.    History of colonic polyps  Rpt colonoscopy pending this year.    Advanced care planning/counseling discussion     Advanced directives: packet provided today. Discussed. Would think wife or oldest daughter be HCPOA     Other Visit Diagnoses   Need for vaccination with 13-polyvalent pneumococcal conjugate vaccine        Relevant Orders       Pneumococcal conjugate vaccine 13-valent (Completed)    Need for influenza vaccination        Relevant Orders       Flu Vaccine QUAD 36+ mos PF IM (Fluarix Quad PF) (Completed)        Follow up plan: Return in about 1 year (around 08/22/2015), or as needed, for medicare wellness.

## 2014-08-21 NOTE — Patient Instructions (Signed)
Flu shot and prevnar today. Advanced planning packet provided today. Good to see you today, call us with questions.

## 2014-08-21 NOTE — Assessment & Plan Note (Signed)
Rpt colonoscopy pending this year.

## 2014-10-07 HISTORY — PX: COLONOSCOPY: SHX174

## 2014-10-09 ENCOUNTER — Ambulatory Visit (AMBULATORY_SURGERY_CENTER): Payer: Self-pay

## 2014-10-09 VITALS — Ht 68.0 in | Wt 165.6 lb

## 2014-10-09 DIAGNOSIS — Z86010 Personal history of colonic polyps: Secondary | ICD-10-CM

## 2014-10-09 NOTE — Progress Notes (Signed)
Per pt, no allergies to soy or egg products.Pt not taking any weight loss meds or using  O2 at home. 

## 2014-10-16 ENCOUNTER — Encounter: Payer: Self-pay | Admitting: Internal Medicine

## 2014-10-16 ENCOUNTER — Ambulatory Visit (AMBULATORY_SURGERY_CENTER): Payer: Medicare Other | Admitting: Internal Medicine

## 2014-10-16 VITALS — BP 113/73 | HR 57 | Temp 96.9°F | Resp 17 | Ht 68.0 in | Wt 165.0 lb

## 2014-10-16 DIAGNOSIS — K5730 Diverticulosis of large intestine without perforation or abscess without bleeding: Secondary | ICD-10-CM

## 2014-10-16 DIAGNOSIS — Z86010 Personal history of colonic polyps: Secondary | ICD-10-CM

## 2014-10-16 MED ORDER — SODIUM CHLORIDE 0.9 % IV SOLN: 500.0000 mL | INTRAVENOUS | Status: DC

## 2014-10-16 NOTE — Op Note (Signed)
[place]  [address] [place], [id number]   COLONOSCOPY PROCEDURE REPORT  PATIENT: Bryan Barker, Bryan Barker  MR#: [id number] BIRTHDATE: [birth year] , 79  yrs. old GENDER: male ENDOSCOPIST: Gatha Mayer, MD, [facility] PROCEDURE DATE:  10/16/2014 PROCEDURE:   Colonoscopy, surveillance First Screening Colonoscopy - Avg.  risk and is 50 yrs.  old or older - No.  Prior Negative Screening - Now for repeat screening. N/A  History of Adenoma - Now for follow-up colonoscopy & has been > or = to 3 yrs.  Yes hx of adenoma.  Has been 3 or more years since last colonoscopy.  Polyps Removed Today? No.  Polyps Removed Today? No.  Recommend repeat exam, <10 yrs? Polyps Removed Today? No.  Recommend repeat exam, <10 yrs? No. ASA CLASS:   Class II INDICATIONS:surveillance colonoscopy based on a history of adenomatous colonic polyp(s). MEDICATIONS: Propofol 200 mg IV and Monitored anesthesia care  DESCRIPTION OF PROCEDURE:   After the risks benefits and alternatives of the procedure were thoroughly explained, informed consent was obtained.  The digital rectal exam revealed no abnormalities of the rectum, revealed no prostatic nodules, and revealed the prostate was not enlarged.   The [id number] endoscope was introduced through the anus and advanced to the cecum, which was identified by both the appendix and ileocecal valve. No adverse events experienced.   The quality of the prep was excellent, using MiraLax  The instrument was then slowly withdrawn as the colon was fully examined.      COLON FINDINGS: Sigmoid diverticulosis.  Otherwise normal colon and rectum with right retroflex exam.  Retroflexed views revealed no abnormalities. The time to cecum=1 minutes 39 seconds.  Withdrawal time=7 minutes 51 seconds.  The scope was withdrawn and the procedure completed. COMPLICATIONS: There were no immediate complications.  ENDOSCOPIC IMPRESSION: 1) Sigmoid diverticulosis 2)  Otherwise normal colon and rectum  RECOMMENDATIONS: Repeat colonoscopy in 5 years 2020/1  eSigned:  Gatha Mayer, MD, [facility] 10/16/2014 10:00 AM   cc: The Patient

## 2014-10-16 NOTE — Patient Instructions (Addendum)
No polyps this time so next colonoscopy 2020 or 2021 (5 years).  I appreciate the opportunity to care for you. Gatha Mayer, MD, [facility]  Diverticulosis (handout given)  YOU HAD AN ENDOSCOPIC PROCEDURE TODAY AT [facility]: Refer to the procedure report that was given to you for any specific questions about what was found during the examination.  If the procedure report does not answer your questions, please call your gastroenterologist to clarify.  If you requested that your care partner not be given the details of your procedure findings, then the procedure report has been included in a sealed envelope for you to review at your convenience later.  YOU SHOULD EXPECT: Some feelings of bloating in the abdomen. Passage of more gas than usual.  Walking can help get rid of the air that was put into your GI tract during the procedure and reduce the bloating. If you had a lower endoscopy (such as a colonoscopy or flexible sigmoidoscopy) you may notice spotting of blood in your stool or on the toilet paper. If you underwent a bowel prep for your procedure, then you may not have a normal bowel movement for a few days.  DIET: Your first meal following the procedure should be a light meal and then it is ok to progress to your normal diet.  A half-sandwich or bowl of soup is an example of a good first meal.  Heavy or fried foods are harder to digest and may make you feel nauseous or bloated.  Likewise meals heavy in dairy and vegetables can cause extra gas to form and this can also increase the bloating.  Drink plenty of fluids but you should avoid alcoholic beverages for 24 hours.  ACTIVITY: Your care partner should take you home directly after the procedure.  You should plan to take it easy, moving slowly for the rest of the day.  You can resume normal activity the day after the procedure however you should NOT DRIVE or use heavy machinery for 24 hours (because of the sedation medicines  used during the test).    SYMPTOMS TO REPORT IMMEDIATELY: A gastroenterologist can be reached at any hour.  During normal business hours, 8:30 AM to 5:00 PM Monday through Friday, call [phone].  After hours and on weekends, please call the GI answering service at [phone] who will take a message and have the physician on call contact you.   Following lower endoscopy (colonoscopy or flexible sigmoidoscopy):  Excessive amounts of blood in the stool  Significant tenderness or worsening of abdominal pains  Swelling of the abdomen that is new, acute  Fever of 100F or higher   FOLLOW UP: If any biopsies were taken you will be contacted by phone or by letter within the next 1-3 weeks.  Call your gastroenterologist if you have not heard about the biopsies in 3 weeks.  Our staff will call the home number listed on your records the next business day following your procedure to check on you and address any questions or concerns that you may have at that time regarding the information given to you following your procedure. This is a courtesy call and so if there is no answer at the home number and we have not heard from you through the emergency physician on call, we will assume that you have returned to your regular daily activities without incident.  SIGNATURES/CONFIDENTIALITY: You and/or your care partner have signed paperwork which will be entered into your electronic  medical record.  These signatures attest to the fact that that the information above on your After Visit Summary has been reviewed and is understood.  Full responsibility of the confidentiality of this discharge information lies with you and/or your care-partner.

## 2014-10-16 NOTE — Progress Notes (Signed)
Report to PACU, RN, vss, BBS= Clear.  

## 2014-10-17 ENCOUNTER — Telehealth: Payer: Self-pay | Admitting: *Deleted

## 2014-10-17 NOTE — Telephone Encounter (Signed)
  Follow up Call-  Call back number 10/16/2014  Post procedure Call Back phone  # [phone]  Permission to leave phone message Yes   [facility]

## 2014-10-30 ENCOUNTER — Encounter: Payer: Self-pay | Admitting: Family Medicine

## 2015-06-13 ENCOUNTER — Other Ambulatory Visit: Payer: Self-pay | Admitting: Family Medicine

## 2015-08-30 ENCOUNTER — Other Ambulatory Visit: Payer: Self-pay | Admitting: Family Medicine

## 2015-08-30 DIAGNOSIS — E7800 Pure hypercholesterolemia, unspecified: Secondary | ICD-10-CM

## 2015-08-30 DIAGNOSIS — Z125 Encounter for screening for malignant neoplasm of prostate: Secondary | ICD-10-CM

## 2015-09-01 ENCOUNTER — Other Ambulatory Visit (INDEPENDENT_AMBULATORY_CARE_PROVIDER_SITE_OTHER): Payer: Medicare Other

## 2015-09-01 DIAGNOSIS — Z125 Encounter for screening for malignant neoplasm of prostate: Secondary | ICD-10-CM | POA: Diagnosis not present

## 2015-09-01 DIAGNOSIS — E7800 Pure hypercholesterolemia, unspecified: Secondary | ICD-10-CM | POA: Diagnosis not present

## 2015-09-01 LAB — COMPREHENSIVE METABOLIC PANEL
ALK PHOS: 64 U/L (ref 39–117)
ALT: 13 U/L (ref 0–53)
AST: 17 U/L (ref 0–37)
Albumin: 4 g/dL (ref 3.5–5.2)
BUN: 14 mg/dL (ref 6–23)
CALCIUM: 9.7 mg/dL (ref 8.4–10.5)
CO2: 31 mEq/L (ref 19–32)
CREATININE: 1 mg/dL (ref 0.40–1.50)
Chloride: 104 mEq/L (ref 96–112)
GFR: 79.37 mL/min (ref >60.00)
Glucose, Bld: 101 mg/dL — ABNORMAL HIGH (ref 70–99)
Potassium: 4.4 mEq/L (ref 3.5–5.1)
Sodium: 141 mEq/L (ref 135–145)
TOTAL PROTEIN: 6.5 g/dL (ref 6.0–8.3)
Total Bilirubin: 0.8 mg/dL (ref 0.2–1.2)

## 2015-09-01 LAB — LIPID PANEL
Cholesterol: 139 mg/dL (ref 0–200)
HDL: 33.7 mg/dL — AB (ref >39.00)
LDL Cholesterol: 85 mg/dL (ref 0–99)
NonHDL: 105.44
TRIGLYCERIDES: 100 mg/dL (ref 0.0–149.0)
Total CHOL/HDL Ratio: 4
VLDL: 20 mg/dL (ref 0.0–40.0)

## 2015-09-01 LAB — PSA, MEDICARE: PSA: 1.08 ng/ml (ref 0.10–4.00)

## 2015-09-08 ENCOUNTER — Ambulatory Visit (INDEPENDENT_AMBULATORY_CARE_PROVIDER_SITE_OTHER): Payer: Medicare Other | Admitting: Family Medicine

## 2015-09-08 ENCOUNTER — Encounter: Payer: Self-pay | Admitting: Family Medicine

## 2015-09-08 VITALS — BP 122/72 | HR 66 | Temp 97.8°F | Wt 161.8 lb

## 2015-09-08 DIAGNOSIS — Z7189 Other specified counseling: Secondary | ICD-10-CM

## 2015-09-08 DIAGNOSIS — Z23 Encounter for immunization: Secondary | ICD-10-CM

## 2015-09-08 DIAGNOSIS — E7800 Pure hypercholesterolemia, unspecified: Secondary | ICD-10-CM

## 2015-09-08 DIAGNOSIS — Z0000 Encounter for general adult medical examination without abnormal findings: Secondary | ICD-10-CM | POA: Diagnosis not present

## 2015-09-08 MED ORDER — PRAVASTATIN SODIUM 40 MG PO TABS: 40.0000 mg | ORAL_TABLET | Freq: Every day | ORAL | Status: DC

## 2015-09-08 NOTE — Assessment & Plan Note (Signed)
Preventative protocols reviewed and updated unless pt declined. Discussed healthy diet and lifestyle.  

## 2015-09-08 NOTE — Progress Notes (Signed)
BP 122/72 mmHg  Pulse 66  Temp(Src) 97.8 F (36.6 C)  Wt 161 lb 12 oz (73.369 kg)   CC: CPE  Subjective:    Patient ID: Bryan Barker, male    DOB: 1951-09-14, 64 y.o.   MRN: [id number]  HPI: Bryan Barker is a 64 y.o. male presenting on 09/08/2015 for Annual Exam   Passes hearing screen today. Vision screen with eye doctor Denies depression/anhedonia, falls.  Preventative: COLONOSCOPY Date: 10/2014 sig diverticulosis, rpt 5 yrs Carlean Purl) Prostate screening - PSA has decreased again. Gets yearly checks. No nocturia. Strong stream.  Flu shot yearly today prevnar 08/2014, pneumovax today Tetanus 2006  Shingles shot - 06/2012  Advanced directives: Discussed. Would think wife or oldest daughter be HCPOA. Encouraged to fill out packet provided last year Seat belt use discussed.  Sunscreen use - doesn't use. Denies changing moles.   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 Interim medical history since our last visit reviewed. Allergies and medications reviewed and updated. Current Outpatient Prescriptions on File Prior to Visit  Medication Sig  . aspirin 81 MG tablet Take 81 mg by mouth daily.  . Omega-3 Fatty Acids (FISH OIL) 1000 MG CAPS Take by mouth daily.     No current facility-administered medications on file prior to visit.    Review of Systems  Constitutional: Negative for fever, chills, activity change, appetite change, fatigue and unexpected weight change.  HENT: Negative for hearing loss.   Eyes: Negative for visual disturbance.  Respiratory: Negative for cough, chest tightness, shortness of breath and wheezing.     Cardiovascular: Negative for chest pain, palpitations and leg swelling.  Gastrointestinal: Negative for nausea, vomiting, abdominal pain, diarrhea, constipation, blood in stool and abdominal distention.  Genitourinary: Negative for hematuria and difficulty urinating.  Musculoskeletal: Negative for myalgias, arthralgias and neck pain.  Skin: Negative for rash.  Neurological: Negative for dizziness, seizures, syncope and headaches.  Hematological: Negative for adenopathy. Does not bruise/bleed easily.  Psychiatric/Behavioral: Negative for dysphoric mood. The patient is not nervous/anxious.    Per HPI unless specifically indicated in ROS section     Objective:    BP 122/72 mmHg  Pulse 66  Temp(Src) 97.8 F (36.6 C)  Wt 161 lb 12 oz (73.369 kg)  Wt Readings from Last 3 Encounters:  09/08/15 161 lb 12 oz (73.369 kg)  10/16/14 165 lb (74.844 kg)  10/09/14 165 lb 9.6 oz (75.116 kg)    Physical Exam  Constitutional: He is oriented to person, place, and time. He appears well-developed and well-nourished. No distress.  HENT:  Head: Normocephalic and atraumatic.  Right Ear: Hearing, tympanic membrane, external ear and ear canal normal.  Left Ear: Hearing, tympanic membrane, external ear and ear canal normal.  Nose: Nose normal.  Mouth/Throat: Uvula is midline, oropharynx is clear and moist and mucous membranes are normal. No oropharyngeal exudate, posterior oropharyngeal edema or posterior oropharyngeal erythema.  Eyes: Conjunctivae and EOM are normal. Pupils are equal, round, and reactive to light. No scleral icterus.  Neck: Normal range of motion. Neck supple. Carotid bruit is not present. No thyromegaly present.  Cardiovascular: Normal rate, regular rhythm, normal heart sounds and intact distal pulses.   No murmur  heard. Pulses:      Radial pulses are 2+ on the right side, and 2+ on the left side.  Pulmonary/Chest: Effort normal and breath sounds normal. No respiratory distress. He  has no wheezes. He has no rales.  Abdominal: Soft. Bowel sounds are normal. He exhibits no distension and no mass. There is no tenderness. There is no rebound and no guarding.  Genitourinary: Rectal exam shows external hemorrhoid (noniflammed). Rectal exam shows no internal hemorrhoid, no fissure, no mass, no tenderness and anal tone normal. Prostate is enlarged (20gm). Prostate is not tender.  Musculoskeletal: Normal range of motion. He exhibits no edema.  Lymphadenopathy:    He has no cervical adenopathy.  Neurological: He is alert and oriented to person, place, and time.  CN grossly intact, station and gait intact Recall 3/3  Calculation 5/5 serial 7s  Skin: Skin is warm and dry. No rash noted.  Psychiatric: He has a normal mood and affect. His behavior is normal. Judgment and thought content normal.  Nursing note and vitals reviewed.  Results for orders placed or performed in visit on 09/01/15  Lipid panel  Result Value Ref Range   Cholesterol 139 0 - 200 mg/dL   Triglycerides 100.0 0.0 - 149.0 mg/dL   HDL 33.70 (L) >39.00 mg/dL   VLDL 20.0 0.0 - 40.0 mg/dL   LDL Cholesterol 85 0 - 99 mg/dL   Total CHOL/HDL Ratio 4    NonHDL 105.44   Comprehensive metabolic panel  Result Value Ref Range   Sodium 141 135 - 145 mEq/L   Potassium 4.4 3.5 - 5.1 mEq/L   Chloride 104 96 - 112 mEq/L   CO2 31 19 - 32 mEq/L   Glucose, Bld 101 (H) 70 - 99 mg/dL   BUN 14 6 - 23 mg/dL   Creatinine, Ser 1.00 0.40 - 1.50 mg/dL   Total Bilirubin 0.8 0.2 - 1.2 mg/dL   Alkaline Phosphatase 64 39 - 117 U/L   AST 17 0 - 37 U/L   ALT 13 0 - 53 U/L   Total Protein 6.5 6.0 - 8.3 g/dL   Albumin 4.0 3.5 - 5.2 g/dL   Calcium 9.7 8.4 - 10.5 mg/dL   GFR 79.37 >60.00 mL/min  PSA, Medicare  Result Value Ref Range   PSA 1.08 0.10 - 4.00 ng/ml      Assessment & Plan:  Hep C screen next labwork   Problem List Items Addressed This Visit    Medicare annual wellness visit, initial - Primary    I have personally  reviewed the Medicare Annual Wellness questionnaire and have noted 1. The patient's medical and social history 2. Their use of alcohol, tobacco or illicit drugs 3. Their current medications and supplements 4. The patient's functional ability including ADL's, fall risks, home safety risks and hearing or visual impairment. Cognitive function has been assessed and addressed as indicated.  5. Diet and physical activity 6. Evidence for depression or mood disorders The patients weight, height, BMI have been recorded in the chart. I have made referrals, counseling and provided education to the patient based on review of the above and I have provided the pt with a written personalized care plan for preventive services. Provider list updated.. See scanned questionairre as needed for further documentation. Reviewed preventative protocols and updated unless pt declined.       Hypercholesteremia    Chronic, stable. Continue pravastatin and fish oil. Encouraged increased aerobic exercise to improve HDL      Relevant  Medications   pravastatin (PRAVACHOL) 40 MG tablet   Healthcare maintenance    Preventative protocols reviewed and updated unless pt declined. Discussed healthy diet and lifestyle.       Advanced care planning/counseling discussion    Advanced directives: Discussed. Would think wife or oldest daughter be HCPOA. Encouraged to fill out packet provided last year          Follow up plan: Return in about 1 year (around 09/07/2016), or as needed, for medicare wellness visit.

## 2015-09-08 NOTE — Progress Notes (Signed)
Pre visit review using our clinic review tool, if applicable. No additional management support is needed unless otherwise documented below in the visit note. 

## 2015-09-08 NOTE — Patient Instructions (Addendum)
Flu shot and pneumovax today. Look into living will/advanced directive You are doing well today - work on increased aerobic exercise Return as needed or in 1 year for next medicare wellness visit  Health Maintenance, Male A healthy lifestyle and preventative care can promote health and wellness.  Maintain regular health, dental, and eye exams.  Eat a healthy diet. Foods like vegetables, fruits, whole grains, low-fat dairy products, and lean protein foods contain the nutrients you need and are low in calories. Decrease your intake of foods high in solid fats, added sugars, and salt. Get information about a proper diet from your health care provider, if necessary.  Regular physical exercise is one of the most important things you can do for your health. Most adults should get at least 150 minutes of moderate-intensity exercise (any activity that increases your heart rate and causes you to sweat) each week. In addition, most adults need muscle-strengthening exercises on 2 or more days a week.   Maintain a healthy weight. The body mass index (BMI) is a screening tool to identify possible weight problems. It provides an estimate of body fat based on height and weight. Your health care provider can find your BMI and can help you achieve or maintain a healthy weight. For males 20 years and older:  A BMI below 18.5 is considered underweight.  A BMI of 18.5 to 24.9 is normal.  A BMI of 25 to 29.9 is considered overweight.  A BMI of 30 and above is considered obese.  Maintain normal blood lipids and cholesterol by exercising and minimizing your intake of saturated fat. Eat a balanced diet with plenty of fruits and vegetables. Blood tests for lipids and cholesterol should begin at age 64 and be repeated every 5 years. If your lipid or cholesterol levels are high, you are over age 80, or you are at high risk for heart disease, you may need your cholesterol levels checked more frequently.Ongoing high  lipid and cholesterol levels should be treated with medicines if diet and exercise are not working.  If you smoke, find out from your health care provider how to quit. If you do not use tobacco, do not start.  Lung cancer screening is recommended for adults aged 55-80 years who are at high risk for developing lung cancer because of a history of smoking. A yearly low-dose CT scan of the lungs is recommended for people who have at least a 30-pack-year history of smoking and are current smokers or have quit within the past 15 years. A pack year of smoking is smoking an average of 1 pack of cigarettes a day for 1 year (for example, a 30-pack-year history of smoking could mean smoking 1 pack a day for 30 years or 2 packs a day for 15 years). Yearly screening should continue until the smoker has stopped smoking for at least 15 years. Yearly screening should be stopped for people who develop a health problem that would prevent them from having lung cancer treatment.  If you choose to drink alcohol, do not have more than 2 drinks per day. One drink is considered to be 12 oz (360 mL) of beer, 5 oz (150 mL) of wine, or 1.5 oz (45 mL) of liquor.  Avoid the use of street drugs. Do not share needles with anyone. Ask for help if you need support or instructions about stopping the use of drugs.  High blood pressure causes heart disease and increases the risk of stroke. High blood pressure is more  likely to develop in:  People who have blood pressure in the end of the normal range (100-139/85-89 mm Hg).  People who are overweight or obese.  People who are African American.  If you are 75-62 years of age, have your blood pressure checked every 3-5 years. If you are 67 years of age or older, have your blood pressure checked every year. You should have your blood pressure measured twice--once when you are at a hospital or clinic, and once when you are not at a hospital or clinic. Record the average of the two  measurements. To check your blood pressure when you are not at a hospital or clinic, you can use:  An automated blood pressure machine at a pharmacy.  A home blood pressure monitor.  If you are 21-25 years old, ask your health care provider if you should take aspirin to prevent heart disease.  Diabetes screening involves taking a blood sample to check your fasting blood sugar level. This should be done once every 3 years after age 31 if you are at a normal weight and without risk factors for diabetes. Testing should be considered at a younger age or be carried out more frequently if you are overweight and have at least 1 risk factor for diabetes.  Colorectal cancer can be detected and often prevented. Most routine colorectal cancer screening begins at the age of 63 and continues through age 68. However, your health care provider may recommend screening at an earlier age if you have risk factors for colon cancer. On a yearly basis, your health care provider may provide home test kits to check for hidden blood in the stool. A small camera at the end of a tube may be used to directly examine the colon (sigmoidoscopy or colonoscopy) to detect the earliest forms of colorectal cancer. Talk to your health care provider about this at age 9 when routine screening begins. A direct exam of the colon should be repeated every 5-10 years through age 17, unless early forms of precancerous polyps or small growths are found.  People who are at an increased risk for hepatitis B should be screened for this virus. You are considered at high risk for hepatitis B if:  You were born in a country where hepatitis B occurs often. Talk with your health care provider about which countries are considered high risk.  Your parents were born in a high-risk country and you have not received a shot to protect against hepatitis B (hepatitis B vaccine).  You have HIV or AIDS.  You use needles to inject street drugs.  You live  with, or have sex with, someone who has hepatitis B.  You are a man who has sex with other men (MSM).  You get hemodialysis treatment.  You take certain medicines for conditions like cancer, organ transplantation, and autoimmune conditions.  Hepatitis C blood testing is recommended for all people born from [birth year] through 1965 and any individual with known risk factors for hepatitis C.  Healthy men should no longer receive prostate-specific antigen (PSA) blood tests as part of routine cancer screening. Talk to your health care provider about prostate cancer screening.  Testicular cancer screening is not recommended for adolescents or adult males who have no symptoms. Screening includes self-exam, a health care provider exam, and other screening tests. Consult with your health care provider about any symptoms you have or any concerns you have about testicular cancer.  Practice safe sex. Use condoms and avoid high-risk sexual practices  to reduce the spread of sexually transmitted infections (STIs).  You should be screened for STIs, including gonorrhea and chlamydia if:  You are sexually active and are younger than 24 years.  You are older than 24 years, and your health care provider tells you that you are at risk for this type of infection.  Your sexual activity has changed since you were last screened, and you are at an increased risk for chlamydia or gonorrhea. Ask your health care provider if you are at risk.  If you are at risk of being infected with HIV, it is recommended that you take a prescription medicine daily to prevent HIV infection. This is called pre-exposure prophylaxis (PrEP). You are considered at risk if:  You are a man who has sex with other men (MSM).  You are a heterosexual man who is sexually active with multiple partners.  You take drugs by injection.  You are sexually active with a partner who has HIV.  Talk with your health care provider about whether you are at  high risk of being infected with HIV. If you choose to begin PrEP, you should first be tested for HIV. You should then be tested every 3 months for as long as you are taking PrEP.  Use sunscreen. Apply sunscreen liberally and repeatedly throughout the day. You should seek shade when your shadow is shorter than you. Protect yourself by wearing long sleeves, pants, a wide-brimmed hat, and sunglasses year round whenever you are outdoors.  Tell your health care provider of new moles or changes in moles, especially if there is a change in shape or color. Also, tell your health care provider if a mole is larger than the size of a pencil eraser.  A one-time screening for abdominal aortic aneurysm (AAA) and surgical repair of large AAAs by ultrasound is recommended for men aged 52-75 years who are current or former smokers.  Stay current with your vaccines (immunizations).   This information is not intended to replace advice given to you by your health care provider. Make sure you discuss any questions you have with your health care provider.   Document Released: 04/21/2008 Document Revised: 11/14/2014 Document Reviewed: 03/21/2011 Elsevier Interactive Patient Education [employer].

## 2015-09-08 NOTE — Assessment & Plan Note (Signed)

## 2015-09-08 NOTE — Assessment & Plan Note (Signed)
Chronic, stable. Continue pravastatin and fish oil. Encouraged increased aerobic exercise to improve HDL

## 2015-09-08 NOTE — Addendum Note (Signed)
Addended by: Royann Shivers A on: 09/08/2015 05:22 PM   Modules accepted: Orders

## 2015-09-08 NOTE — Assessment & Plan Note (Signed)
Advanced directives: Discussed. Would think wife or oldest daughter be HCPOA. Encouraged to fill out packet provided last year

## 2016-08-01 ENCOUNTER — Ambulatory Visit (INDEPENDENT_AMBULATORY_CARE_PROVIDER_SITE_OTHER): Payer: Medicare Other | Admitting: Primary Care

## 2016-08-01 VITALS — BP 122/78 | HR 70 | Temp 97.8°F | Ht 67.5 in | Wt 163.1 lb

## 2016-08-01 DIAGNOSIS — H6121 Impacted cerumen, right ear: Secondary | ICD-10-CM | POA: Diagnosis not present

## 2016-08-01 NOTE — Progress Notes (Signed)
Subjective:    Patient ID: Bryan Barker, male    DOB: 29-Mar-1951, 65 y.o.   MRN: [id number]  HPI  Bryan Barker is a 65 year old male who presents today with a chief complaint of ear fullness. The fullness is located to the right ear. He denies pain, sore throat, cough, fevers, chills, congestion. He believes his right ear is immpacted with ear wax as he has a history of cerumen impaction. His wife attempted to use a bulb syringe with minor removal of wax this weekend.   Review of Systems  Constitutional: Negative for fever.  HENT: Negative for congestion, ear pain, rhinorrhea, sinus pressure and sore throat.        Right ear fullness.  Respiratory: Negative for cough.        Past Medical History:  Diagnosis Date  . Dehydration 03/2011   ER for syncope attributed to dehydration  . Diverticulosis    by colonoscopy  . Hyperlipidemia      Social History   Social History  . Marital status: Married    Spouse name: N/A  . Number of children: 2  . Years of education: N/A   Occupational History  . [employer]     retiring 2/11   Social History Main Topics  . Smoking status: Former Smoker    Packs/day: 1.00    Years: 3.00    Types: Cigarettes    Quit date: 06/16/1968  . Smokeless tobacco: Never Used     Comment: only smoked as a teenager  . Alcohol use No  . Drug use: No  . Sexual activity: Yes   Other Topics Concern  . 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 COLONOSCOPY  10/2014   sig diverticulosis, rpt 5 yrs Carlean Purl)  . 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 Diabetes Mother   . Hypertension Mother   . Heart disease Mother     Pacer  . Colon polyps Mother   . Coronary artery disease Father 39    MI  . Cancer Brother     esoph, mets to brain  . Diabetes Brother   . Alcohol abuse Sister   . Colon cancer Neg Hx   . Stroke Neg Hx     No Known Allergies  Current Outpatient Prescriptions on File Prior to Visit  Medication Sig Dispense Refill  . aspirin 81 MG tablet Take 81 mg by mouth daily.    . Omega-3 Fatty Acids (FISH OIL) 1000 MG CAPS Take by mouth daily.      . pravastatin (PRAVACHOL) 40 MG tablet Take 1 tablet (40 mg total) by mouth daily. 90 tablet 3   No current facility-administered medications on file prior to visit.     BP 122/78   Pulse 70   Temp 97.8 F (36.6 C) (Oral)   Ht 5' 7.5" (1.715 m)   Wt 163 lb 1.9 oz (74 kg)  SpO2 98%   BMI 25.17 kg/m    Objective:   Physical Exam  Constitutional: He appears well-nourished.  HENT:  Right Ear: Tympanic membrane and ear canal normal.  Left Ear: Tympanic membrane and ear canal normal.  Nose: No mucosal edema. Right sinus exhibits no maxillary sinus tenderness and no frontal sinus tenderness. Left sinus exhibits no maxillary sinus tenderness and no frontal sinus tenderness.  Mouth/Throat: Oropharynx is clear and moist.  Right ear with cerumen impaction. Post irrigation TM unremarkable.  Eyes: Conjunctivae are normal.  Neck: Neck supple.  Cardiovascular: Normal rate and regular rhythm.   Pulmonary/Chest: Effort normal and breath sounds normal. He has no wheezes. He has no rales.  Skin: Skin is warm and dry.          Assessment & Plan:  Cerumen Impaction:  Ear fullness for the past week.  No other respiratory symptoms. Vitals stable. Moderate cerumen impaction to right canal. Right canal irrigated and TM's unremarkable post irrigation. Discussed use of Debrox  drops. Follow up PRN.  Sheral Flow, NP

## 2016-08-01 NOTE — Progress Notes (Signed)
Pre visit review using our clinic review tool, if applicable. No additional management support is needed unless otherwise documented below in the visit note. 

## 2016-08-01 NOTE — Patient Instructions (Signed)
Your ear was impacted with ear wax.  You may try using Debrox drops for future ear fullness in an attempt to remove the wax. This may be purchased over the counter.  Please call us if you develop pain, fevers, chills.  It was a pleasure meeting you!  Cerumen Impaction The structures of the external ear canal secrete a waxy substance known as cerumen. Excess cerumen can build up in the ear canal, causing a condition known as cerumen impaction. Cerumen impaction can cause ear pain and disrupt the function of the ear. The rate of cerumen production differs for each individual. In certain individuals, the configuration of the ear canal may decrease his or her ability to naturally remove cerumen. CAUSES Cerumen impaction is caused by excessive cerumen production or buildup. RISK FACTORS  Frequent use of swabs to clean ears.  Having narrow ear canals.  Having eczema.  Being dehydrated. SIGNS AND SYMPTOMS  Diminished hearing.  Ear drainage.  Ear pain.  Ear itch. TREATMENT Treatment may involve:  Over-the-counter or prescription ear drops to soften the cerumen.  Removal of cerumen by a health care provider. This may be done with:  Irrigation with warm water. This is the most common method of removal.  Ear curettes and other instruments.  Surgery. This may be done in severe cases. HOME CARE INSTRUCTIONS  Take medicines only as directed by your health care provider.  Do not insert objects into the ear with the intent of cleaning the ear. PREVENTION  Do not insert objects into the ear, even with the intent of cleaning the ear. Removing cerumen as a part of normal hygiene is not necessary, and the use of swabs in the ear canal is not recommended.  Drink enough water to keep your urine clear or pale yellow.  Control your eczema if you have it. SEEK MEDICAL CARE IF:  You develop ear pain.  You develop bleeding from the ear.  The cerumen does not clear after you use ear  drops as directed.   This information is not intended to replace advice given to you by your health care provider. Make sure you discuss any questions you have with your health care provider.   Document Released: 12/01/2004 Document Revised: 11/14/2014 Document Reviewed: 06/10/2015 Elsevier Interactive Patient Education [employer].

## 2016-08-25 ENCOUNTER — Encounter: Payer: Medicare Other | Admitting: Family Medicine

## 2016-08-25 ENCOUNTER — Other Ambulatory Visit: Payer: Self-pay | Admitting: Family Medicine

## 2016-08-25 ENCOUNTER — Ambulatory Visit (INDEPENDENT_AMBULATORY_CARE_PROVIDER_SITE_OTHER): Payer: Medicare Other

## 2016-08-25 VITALS — BP 110/68 | HR 68 | Temp 97.9°F | Ht 67.0 in | Wt 161.5 lb

## 2016-08-25 DIAGNOSIS — Z125 Encounter for screening for malignant neoplasm of prostate: Secondary | ICD-10-CM | POA: Diagnosis not present

## 2016-08-25 DIAGNOSIS — D72829 Elevated white blood cell count, unspecified: Secondary | ICD-10-CM

## 2016-08-25 DIAGNOSIS — E7800 Pure hypercholesterolemia, unspecified: Secondary | ICD-10-CM

## 2016-08-25 DIAGNOSIS — Z23 Encounter for immunization: Secondary | ICD-10-CM

## 2016-08-25 DIAGNOSIS — Z1159 Encounter for screening for other viral diseases: Secondary | ICD-10-CM | POA: Diagnosis not present

## 2016-08-25 DIAGNOSIS — Z0000 Encounter for general adult medical examination without abnormal findings: Secondary | ICD-10-CM

## 2016-08-25 LAB — COMPREHENSIVE METABOLIC PANEL
ALT: 13 U/L (ref 0–53)
AST: 17 U/L (ref 0–37)
Albumin: 4.3 g/dL (ref 3.5–5.2)
Alkaline Phosphatase: 74 U/L (ref 39–117)
BILIRUBIN TOTAL: 0.6 mg/dL (ref 0.2–1.2)
BUN: 16 mg/dL (ref 6–23)
CALCIUM: 9.8 mg/dL (ref 8.4–10.5)
CO2: 33 meq/L — AB (ref 19–32)
CREATININE: 1.09 mg/dL (ref 0.40–1.50)
Chloride: 102 mEq/L (ref 96–112)
GFR: 71.64 mL/min (ref >60.00)
Glucose, Bld: 99 mg/dL (ref 70–99)
Potassium: 5 mEq/L (ref 3.5–5.1)
SODIUM: 141 meq/L (ref 135–145)
Total Protein: 7.2 g/dL (ref 6.0–8.3)

## 2016-08-25 LAB — LIPID PANEL
CHOL/HDL RATIO: 4
Cholesterol: 146 mg/dL (ref 0–200)
HDL: 36 mg/dL — ABNORMAL LOW (ref >39.00)
LDL Cholesterol: 90 mg/dL (ref 0–99)
NONHDL: 110.08
TRIGLYCERIDES: 100 mg/dL (ref 0.0–149.0)
VLDL: 20 mg/dL (ref 0.0–40.0)

## 2016-08-25 LAB — CBC WITH DIFFERENTIAL/PLATELET
BASOS ABS: 0 10*3/uL (ref 0.0–0.1)
BASOS PCT: 0.4 % (ref 0.0–3.0)
EOS ABS: 0.2 10*3/uL (ref 0.0–0.7)
Eosinophils Relative: 3.3 % (ref 0.0–5.0)
HCT: 43.7 % (ref 39.0–52.0)
Hemoglobin: 15.2 g/dL (ref 13.0–17.0)
LYMPHS ABS: 2 10*3/uL (ref 0.7–4.0)
Lymphocytes Relative: 35.2 % (ref 12.0–46.0)
MCHC: 34.8 g/dL (ref 30.0–36.0)
MCV: 89.1 fl (ref 78.0–100.0)
MONO ABS: 0.6 10*3/uL (ref 0.1–1.0)
Monocytes Relative: 10.9 % (ref 3.0–12.0)
NEUTROS ABS: 2.9 10*3/uL (ref 1.4–7.7)
Neutrophils Relative %: 50.2 % (ref 43.0–77.0)
Platelets: 242 10*3/uL (ref 150.0–400.0)
RBC: 4.9 Mil/uL (ref 4.22–5.81)
RDW: 13 % (ref 11.5–15.5)
WBC: 5.7 10*3/uL (ref 4.0–10.5)

## 2016-08-25 LAB — PSA, MEDICARE: PSA: 3.39 ng/mL (ref 0.10–4.00)

## 2016-08-25 MED ORDER — TETANUS-DIPHTHERIA TOXOIDS TD 5-2 LFU IM INJ
0.5000 mL | INJECTION | Freq: Once | INTRAMUSCULAR | Status: AC
  Administered 2016-08-25: 0.5 mL via INTRAMUSCULAR

## 2016-08-25 NOTE — Progress Notes (Signed)
I reviewed health advisor's note, was available for consultation, and agree with documentation and plan.  

## 2016-08-25 NOTE — Progress Notes (Signed)
PCP notes:   Health maintenance:  Flu vaccine - administered  TD vaccine - administered Hep C screening - completed  Abnormal screenings:   Hearing - failed  Patient concerns:   None  Nurse concerns:  None  Next PCP appt:   09/01/2016 @ 1230

## 2016-08-25 NOTE — Patient Instructions (Signed)
Mr. Tonks , Thank you for taking time to come for your Medicare Wellness Visit. I appreciate your ongoing commitment to your health goals. Please review the following plan we discussed and let me know if I can assist you in the future.   These are the goals we discussed: Goals    . 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       This is a list of the screening recommended for you and due dates:  Health Maintenance  Topic Date Due  . DTaP/Tdap/Td vaccine (1 - Tdap) 08/25/2026*  . Colon Cancer Screening  10/17/2019  . Tetanus Vaccine  08/25/2026  . Flu Shot  Completed  . Shingles Vaccine  Completed  .  Hepatitis C: One time screening is recommended by Center for Disease Control  (CDC) for  adults born from [birth year] through 1967.   Completed  . Pneumonia vaccines  Completed  *Topic was postponed. The date shown is not the original due date.   Preventive Care for Adults  A healthy lifestyle and preventive care can promote health and wellness. Preventive health guidelines for adults include the following key practices.  . A routine yearly physical is a good way to check with your health care provider about your health and preventive screening. It is a chance to share any concerns and updates on your health and to receive a thorough exam.  . Visit your dentist for a routine exam and preventive care every 6 months. Brush your teeth twice a day and floss once a day. Good oral hygiene prevents tooth decay and gum disease.  . The frequency of eye exams is based on your age, health, family medical history, use  of contact lenses, and other factors. Follow your health care provider's ecommendations for frequency of eye exams.  . Eat a healthy diet. Foods like vegetables, fruits, whole grains, low-fat dairy products, and lean protein foods contain the nutrients you need without too many calories. Decrease your intake of foods  high in solid fats, added sugars, and salt. Eat the right amount of calories for you. Get information about a proper diet from your health care provider, if necessary.  . Regular physical exercise is one of the most important things you can do for your health. Most adults should get at least 150 minutes of moderate-intensity exercise (any activity that increases your heart rate and causes you to sweat) each week. In addition, most adults need muscle-strengthening exercises on 2 or more days a week.  Silver Sneakers may be a benefit available to you. To determine eligibility, you may visit the website: www.silversneakers.com or contact program at 970-323-2993 Mon-Fri between 8AM-8PM.   . Maintain a healthy weight. The body mass index (BMI) is a screening tool to identify possible weight problems. It provides an estimate of body fat based on height and weight. Your health care provider can find your BMI and can help you achieve or maintain a healthy weight.   For adults 20 years and older: ? A BMI below 18.5 is considered underweight. ? A BMI of 18.5 to 24.9 is normal. ? A BMI of 25 to 29.9 is considered overweight. ? A BMI of 30 and above is considered obese.   . Maintain normal blood lipids and cholesterol levels by exercising and minimizing your intake of saturated fat. Eat a balanced diet with plenty of fruit and vegetables.  Blood tests for lipids and cholesterol should begin at age 70 and be repeated every 5 years. If your lipid or cholesterol levels are high, you are over 50, or you are at high risk for heart disease, you may need your cholesterol levels checked more frequently. Ongoing high lipid and cholesterol levels should be treated with medicines if diet and exercise are not working.  . If you smoke, find out from your health care provider how to quit. If you do not use tobacco, please do not start.  . If you choose to drink alcohol, please do not consume more than 2 drinks per day.  One drink is considered to be 12 ounces (355 mL) of beer, 5 ounces (148 mL) of wine, or 1.5 ounces (44 mL) of liquor.  . If you are 75-10 years old, ask your health care provider if you should take aspirin to prevent strokes.  . Use sunscreen. Apply sunscreen liberally and repeatedly throughout the day. You should seek shade when your shadow is shorter than you. Protect yourself by wearing long sleeves, pants, a wide-brimmed hat, and sunglasses year round, whenever you are outdoors.  . Once a month, do a whole body skin exam, using a mirror to look at the skin on your back. Tell your health care provider of new moles, moles that have irregular borders, moles that are larger than a pencil eraser, or moles that have changed in shape or color.

## 2016-08-25 NOTE — Progress Notes (Signed)
Pre visit review using our clinic review tool, if applicable. No additional management support is needed unless otherwise documented below in the visit note. 

## 2016-08-25 NOTE — Progress Notes (Signed)
Subjective:   Bryan Barker is a 65 y.o. male who presents for Medicare Annual/Subsequent preventive examination.  Review of Systems:  N/A Cardiac Risk Factors include: advanced age (>64men, >60 women);male gender;dyslipidemia     Objective:    Vitals: BP 110/68 (BP Location: Left Arm, Patient Position: Sitting, Cuff Size: Normal)   Pulse 68   Temp 97.9 F (36.6 C) (Oral)   Ht 5\' 7"  (1.702 m) Comment: no shoes  Wt 161 lb 8 oz (73.3 kg)   SpO2 95%   BMI 25.29 kg/m   Body mass index is 25.29 kg/m.  Tobacco History  Smoking Status  . Former Smoker  . Packs/day: 1.00  . Years: 3.00  . Types: Cigarettes  . Quit date: 06/16/1968  Smokeless Tobacco  . Never Used    Comment: only smoked as a teenager     Counseling given: No   Past Medical History:  Diagnosis Date  . Dehydration 03/2011   ER for syncope attributed to dehydration  . Diverticulosis    by colonoscopy  . Hyperlipidemia    Past Surgical History:  Procedure Laterality Date  . 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 COLONOSCOPY  10/2014   sig diverticulosis, rpt 5 yrs Carlean Purl)  . 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 Diabetes Mother   . Hypertension Mother   . Heart disease Mother     Pacer  . Colon polyps Mother   . Coronary artery disease Father 3    MI  . Cancer Brother     esoph, mets to brain  . Diabetes Brother   . Alcohol abuse Sister   . Colon cancer Neg Hx   . Stroke Neg Hx    History  Sexual Activity  . Sexual activity: Yes    Outpatient Encounter Prescriptions as of 08/25/2016  Medication Sig  . aspirin 81 MG tablet Take 81 mg by mouth daily.  . Omega-3 Fatty Acids (FISH OIL) 1000 MG CAPS Take by mouth daily.    . pravastatin (PRAVACHOL) 40 MG tablet Take 1 tablet (40 mg total) by mouth daily.  . [EXPIRED] tetanus &  diphtheria toxoids (adult) (TENIVAC) injection 0.5 mL    No facility-administered encounter medications on file as of 08/25/2016.     Activities of Daily Living In your present state of health, do you have any difficulty performing the following activities: 08/25/2016  Hearing? N  Vision? N  Difficulty concentrating or making decisions? N  Walking or climbing stairs? N  Dressing or bathing? N  Doing errands, shopping? N  Preparing Food and eating ? N  Using the Toilet? N  In the past six months, have you accidently leaked urine? N  Do you have problems with loss of bowel control? N  Managing your Medications? N  Managing your Finances? N  Housekeeping or managing your Housekeeping? N  Some recent data might be hidden    Patient Care Team: Ria Bush, MD as PCP - General (Family Medicine)   Assessment:     Hearing Screening   125Hz  250Hz  500Hz  1000Hz  2000Hz  3000Hz  4000Hz  6000Hz  8000Hz   Right ear:   40 40 40  0    Left ear:   40 40 40  0      Visual Acuity Screening   Right eye Left eye Both  eyes  Without correction:     With correction: 20/40 20/20 20/20     Exercise Activities and Dietary recommendations Current Exercise Habits: The patient has a physically strenous job, but has no regular exercise apart from work., Exercise limited by: None identified  Goals    . 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      Fall Risk Fall Risk  08/25/2016 09/08/2015 08/21/2014  Falls in the past year? No No No   Depression Screen PHQ 2/9 Scores 08/25/2016 09/08/2015 08/21/2014  PHQ - 2 Score 0 0 0    Cognitive Function MMSE - Mini Mental State Exam 08/25/2016  Orientation to time 5  Orientation to Place 5  Registration 3  Attention/ Calculation 0  Recall 3  Language- name 2 objects 0  Language- repeat 1  Language- follow 3 step command 3  Language- read & follow direction 0  Write a  sentence 0  Copy design 0  Total score 20     PLEASE NOTE: A Mini-Cog screen was completed. Maximum score is 20. A value of 0 denotes this part of Folstein MMSE was not completed or the patient failed this part of the Mini-Cog screening.   Mini-Cog Screening Orientation to Time - Max 5 pts Orientation to Place - Max 5 pts Registration - Max 3 pts Recall - Max 3 pts Language Repeat - Max 1 pts Language Follow 3 Step Command - Max 3 pts     Immunization History  Administered Date(s) Administered  . Influenza Split 08/23/2011, 08/21/2012  . Influenza,inj,Quad PF,36+ Mos 08/20/2013, 08/21/2014, 09/08/2015, 08/25/2016  . Pneumococcal Conjugate-13 08/21/2014  . Pneumococcal Polysaccharide-23 09/08/2015  . Td 06/17/2005, 08/25/2016  . Zoster 06/28/2012   Screening Tests Health Maintenance  Topic Date Due  . DTaP/Tdap/Td (1 - Tdap) 08/25/2026 (Originally 06/18/2005)  . COLONOSCOPY  10/17/2019  . TETANUS/TDAP  08/25/2026  . INFLUENZA VACCINE  Completed  . ZOSTAVAX  Completed  . Hepatitis C Screening  Completed  . PNA vac Low Risk Adult  Completed      Plan:     I have personally reviewed and addressed the Medicare Annual Wellness questionnaire and have noted the following in the patient's chart:  A. Medical and social history B. Use of alcohol, tobacco or illicit drugs  C. Current medications and supplements D. Functional ability and status E.  Nutritional status F.  Physical activity G. Advance directives H. List of other physicians I.  Hospitalizations, surgeries, and ER visits in previous 12 months J.  [place] to include hearing, vision, cognitive, depression L. Referrals and appointments - none  In addition, I have reviewed and discussed with patient certain preventive protocols, quality metrics, and best practice recommendations. A written personalized care plan for preventive services as well as general preventive health recommendations were provided to  patient.  See attached scanned questionnaire for additional information.   Signed,   Lindell Noe, MHA, BS, LPN Health Coach

## 2016-08-26 LAB — HEPATITIS C ANTIBODY: HCV Ab: NEGATIVE

## 2016-09-01 ENCOUNTER — Ambulatory Visit (INDEPENDENT_AMBULATORY_CARE_PROVIDER_SITE_OTHER): Payer: Medicare Other | Admitting: Family Medicine

## 2016-09-01 ENCOUNTER — Encounter: Payer: Self-pay | Admitting: Family Medicine

## 2016-09-01 VITALS — BP 110/72 | HR 80 | Temp 98.0°F | Wt 162.0 lb

## 2016-09-01 DIAGNOSIS — Z7189 Other specified counseling: Secondary | ICD-10-CM

## 2016-09-01 DIAGNOSIS — K644 Residual hemorrhoidal skin tags: Secondary | ICD-10-CM | POA: Insufficient documentation

## 2016-09-01 DIAGNOSIS — Z0000 Encounter for general adult medical examination without abnormal findings: Secondary | ICD-10-CM

## 2016-09-01 DIAGNOSIS — E7800 Pure hypercholesterolemia, unspecified: Secondary | ICD-10-CM | POA: Diagnosis not present

## 2016-09-01 MED ORDER — PRAVASTATIN SODIUM 40 MG PO TABS: 40.0000 mg | ORAL_TABLET | Freq: Every day | ORAL | 3 refills | Status: DC

## 2016-09-01 NOTE — Progress Notes (Signed)
BP 110/72 (BP Location: Left Arm, Patient Position: Sitting, Cuff Size: Normal)   Pulse 80   Temp 98 F (36.7 C) (Oral)   Wt 162 lb (73.5 kg)   SpO2 97%   BMI 25.37 kg/m    CC: CPE Subjective:    Patient ID: Bryan Barker, male    DOB: Mar 20, 1951, 65 y.o.   MRN: [id number]  HPI: Bryan Barker is a 65 y.o. male presenting on 09/01/2016 for Medicare Wellness (Part 2. No questions or concerns)   Saw Lesia last week for medicare wellness visit, note reviewed.   Preventative: COLONOSCOPY Date: 10/2014 sig diverticulosis, rpt 5 yrs Carlean Purl) Prostate screening - PSA has decreased again. Gets yearly checks. No nocturia. Strong stream.  Flu shot yearly prevnar 08/2014, pneumovax 2016 Tetanus 08/2016 Shingles shot - 06/2012  Advanced directives: Discussed. Would think wife or oldest daughter be HCPOA. Encouraged to fill out packet provided last year Seat belt use discussed.  Sunscreen use discussed. Denies changing moles.  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 Interim medical history since our last visit reviewed. Allergies and medications reviewed and updated. Current Outpatient Prescriptions on File Prior to Visit  Medication Sig  . aspirin 81 MG tablet Take 81 mg by mouth daily.  . Omega-3 Fatty Acids (FISH OIL) 1000 MG CAPS Take by mouth daily.     No current facility-administered medications on file prior to visit.     Review of Systems  Constitutional: Negative for activity change, appetite change, chills, fatigue, fever and unexpected weight change.  HENT: Negative for hearing loss.   Eyes: Negative for  visual disturbance.  Respiratory: Negative for cough, chest tightness, shortness of breath and wheezing.   Cardiovascular: Negative for chest pain, palpitations and leg swelling.  Gastrointestinal: Negative for abdominal distention, abdominal pain, blood in stool, constipation, diarrhea, nausea and vomiting.  Genitourinary: Negative for difficulty urinating and hematuria.  Musculoskeletal: Negative for arthralgias, myalgias and neck pain.  Skin: Negative for rash.  Neurological: Negative for dizziness, seizures, syncope and headaches.  Hematological: Negative for adenopathy. Does not bruise/bleed easily.  Psychiatric/Behavioral: Negative for dysphoric mood. The patient is not nervous/anxious.    Per HPI unless specifically indicated in ROS section     Objective:    BP 110/72 (BP Location: Left Arm, Patient Position: Sitting, Cuff Size: Normal)   Pulse 80   Temp 98 F (36.7 C) (Oral)   Wt 162 lb (73.5 kg)   SpO2 97%   BMI 25.37 kg/m   Wt Readings from Last 3 Encounters:  09/01/16 162 lb (73.5 kg)  08/25/16 161 lb 8 oz (73.3 kg)  08/01/16 163 lb 1.9 oz (74 kg)    Physical Exam  Constitutional: He is oriented to person, place, and time. He appears well-developed and well-nourished. No distress.  HENT:  Head: Normocephalic and atraumatic.  Right Ear: Hearing, tympanic membrane, external ear and ear canal normal.  Left Ear: Hearing, tympanic membrane, external ear and ear canal normal.  Nose: Nose normal.  Mouth/Throat: Uvula is midline, oropharynx is clear and moist and mucous membranes are normal. No oropharyngeal exudate, posterior oropharyngeal edema or posterior oropharyngeal erythema.  Eyes: Conjunctivae and EOM  are normal. Pupils are equal, round, and reactive to light. No scleral icterus.  Neck: Normal range of motion. Neck supple. Carotid bruit is not present. No thyromegaly present.  Cardiovascular: Normal rate, regular rhythm, normal heart sounds and intact distal pulses.    No murmur heard. Pulses:      Radial pulses are 2+ on the right side, and 2+ on the left side.  Pulmonary/Chest: Effort normal and breath sounds normal. No respiratory distress. He has no wheezes. He has no rales.  Abdominal: Soft. Bowel sounds are normal. He exhibits no distension and no mass. There is no tenderness. There is no rebound and no guarding.  Genitourinary: Prostate normal. Rectal exam shows external hemorrhoid (L sided). Rectal exam shows no internal hemorrhoid, no fissure, no mass, no tenderness and anal tone normal. Prostate is not enlarged (20gm) and not tender.  Musculoskeletal: Normal range of motion. He exhibits no edema.  Lymphadenopathy:    He has no cervical adenopathy.  Neurological: He is alert and oriented to person, place, and time.  CN grossly intact, station and gait intact  Skin: Skin is warm and dry. No rash noted.  Psychiatric: He has a normal mood and affect. His behavior is normal. Judgment and thought content normal.  Nursing note and vitals reviewed.  Results for orders placed or performed in visit on 08/25/16  Lipid panel  Result Value Ref Range   Cholesterol 146 0 - 200 mg/dL   Triglycerides 100.0 0.0 - 149.0 mg/dL   HDL 36.00 (L) >39.00 mg/dL   VLDL 20.0 0.0 - 40.0 mg/dL   LDL Cholesterol 90 0 - 99 mg/dL   Total CHOL/HDL Ratio 4    NonHDL 110.08   Comprehensive metabolic panel  Result Value Ref Range   Sodium 141 135 - 145 mEq/L   Potassium 5.0 3.5 - 5.1 mEq/L   Chloride 102 96 - 112 mEq/L   CO2 33 (H) 19 - 32 mEq/L   Glucose, Bld 99 70 - 99 mg/dL   BUN 16 6 - 23 mg/dL   Creatinine, Ser 1.09 0.40 - 1.50 mg/dL   Total Bilirubin 0.6 0.2 - 1.2 mg/dL   Alkaline Phosphatase 74 39 - 117 U/L   AST 17 0 - 37 U/L   ALT 13 0 - 53 U/L   Total Protein 7.2 6.0 - 8.3 g/dL   Albumin 4.3 3.5 - 5.2 g/dL   Calcium 9.8 8.4 - 10.5 mg/dL   GFR 71.64 >60.00 mL/min  CBC with Differential/Platelet  Result Value Ref Range   WBC 5.7 4.0 - 10.5 K/uL   RBC  4.90 4.22 - 5.81 Mil/uL   Hemoglobin 15.2 13.0 - 17.0 g/dL   HCT 43.7 39.0 - 52.0 %   MCV 89.1 78.0 - 100.0 fl   MCHC 34.8 30.0 - 36.0 g/dL   RDW 13.0 11.5 - 15.5 %   Platelets 242.0 150.0 - 400.0 K/uL   Neutrophils Relative % 50.2 43.0 - 77.0 %   Lymphocytes Relative 35.2 12.0 - 46.0 %   Monocytes Relative 10.9 3.0 - 12.0 %   Eosinophils Relative 3.3 0.0 - 5.0 %   Basophils Relative 0.4 0.0 - 3.0 %   Neutro Abs 2.9 1.4 - 7.7 K/uL   Lymphs Abs 2.0 0.7 - 4.0 K/uL   Monocytes Absolute 0.6 0.1 - 1.0 K/uL   Eosinophils Absolute 0.2 0.0 - 0.7 K/uL   Basophils Absolute 0.0 0.0 - 0.1 K/uL  PSA, Medicare  Result Value Ref Range  PSA 3.39 0.10 - 4.00 ng/ml  Hepatitis C antibody  Result Value Ref Range   HCV Ab NEGATIVE NEGATIVE      Assessment & Plan:   Problem List Items Addressed This Visit    Advanced care planning/counseling discussion    Advanced directives: Discussed. Would think wife or oldest daughter be HCPOA. Encouraged to fill out packet provided last year      External hemorrhoids    Pt declines further management.       Relevant Medications   pravastatin (PRAVACHOL) 40 MG tablet   Healthcare maintenance - Primary    Preventative protocols reviewed and updated unless pt declined. Discussed healthy diet and lifestyle.       Hypercholesteremia    Chronic, stable. Continue pravastatin. Triglyceride levels ok - discussed ok to hold fish oil, recommended increased fatty fish in diet.       Relevant Medications   pravastatin (PRAVACHOL) 40 MG tablet    Other Visit Diagnoses   None.      Follow up plan: Return in about 1 year (around 09/01/2017) for annual exam, prior fasting for blood work.  Ria Bush, MD

## 2016-09-01 NOTE — Assessment & Plan Note (Signed)
Pt declines further management.

## 2016-09-01 NOTE — Assessment & Plan Note (Signed)
Preventative protocols reviewed and updated unless pt declined. Discussed healthy diet and lifestyle.  

## 2016-09-01 NOTE — Assessment & Plan Note (Signed)
Chronic, stable. Continue pravastatin. Triglyceride levels ok - discussed ok to hold fish oil, recommended increased fatty fish in diet.

## 2016-09-01 NOTE — Progress Notes (Signed)
Pre visit review using our clinic review tool, if applicable. No additional management support is needed unless otherwise documented below in the visit note. 

## 2016-09-01 NOTE — Assessment & Plan Note (Signed)
Advanced directives: Discussed. Would think wife or oldest daughter be HCPOA. Encouraged to fill out packet provided last year

## 2016-09-01 NOTE — Patient Instructions (Addendum)
May try off fish oil. Try to get more fatty fish in diet.  You are doing well today Return as needed or in 1 year for next physical.  Health Maintenance, Male A healthy lifestyle and preventative care can promote health and wellness.  Maintain regular health, dental, and eye exams.  Eat a healthy diet. Foods like vegetables, fruits, whole grains, low-fat dairy products, and lean protein foods contain the nutrients you need and are low in calories. Decrease your intake of foods high in solid fats, added sugars, and salt. Get information about a proper diet from your health care provider, if necessary.  Regular physical exercise is one of the most important things you can do for your health. Most adults should get at least 150 minutes of moderate-intensity exercise (any activity that increases your heart rate and causes you to sweat) each week. In addition, most adults need muscle-strengthening exercises on 2 or more days a week.   Maintain a healthy weight. The body mass index (BMI) is a screening tool to identify possible weight problems. It provides an estimate of body fat based on height and weight. Your health care provider can find your BMI and can help you achieve or maintain a healthy weight. For males 20 years and older:  A BMI below 18.5 is considered underweight.  A BMI of 18.5 to 24.9 is normal.  A BMI of 25 to 29.9 is considered overweight.  A BMI of 30 and above is considered obese.  Maintain normal blood lipids and cholesterol by exercising and minimizing your intake of saturated fat. Eat a balanced diet with plenty of fruits and vegetables. Blood tests for lipids and cholesterol should begin at age 7 and be repeated every 5 years. If your lipid or cholesterol levels are high, you are over age 23, or you are at high risk for heart disease, you may need your cholesterol levels checked more frequently.Ongoing high lipid and cholesterol levels should be treated with medicines if  diet and exercise are not working.  If you smoke, find out from your health care provider how to quit. If you do not use tobacco, do not start.  Lung cancer screening is recommended for adults aged 32-80 years who are at high risk for developing lung cancer because of a history of smoking. A yearly low-dose CT scan of the lungs is recommended for people who have at least a 30-pack-year history of smoking and are current smokers or have quit within the past 15 years. A pack year of smoking is smoking an average of 1 pack of cigarettes a day for 1 year (for example, a 30-pack-year history of smoking could mean smoking 1 pack a day for 30 years or 2 packs a day for 15 years). Yearly screening should continue until the smoker has stopped smoking for at least 15 years. Yearly screening should be stopped for people who develop a health problem that would prevent them from having lung cancer treatment.  If you choose to drink alcohol, do not have more than 2 drinks per day. One drink is considered to be 12 oz (360 mL) of beer, 5 oz (150 mL) of wine, or 1.5 oz (45 mL) of liquor.  Avoid the use of street drugs. Do not share needles with anyone. Ask for help if you need support or instructions about stopping the use of drugs.  High blood pressure causes heart disease and increases the risk of stroke. High blood pressure is more likely to develop in:  People who have blood pressure in the end of the normal range (100-139/85-89 mm Hg).  People who are overweight or obese.  People who are African American.  If you are 60-4 years of age, have your blood pressure checked every 3-5 years. If you are 65 years of age or older, have your blood pressure checked every year. You should have your blood pressure measured twice--once when you are at a hospital or clinic, and once when you are not at a hospital or clinic. Record the average of the two measurements. To check your blood pressure when you are not at a  hospital or clinic, you can use:  An automated blood pressure machine at a pharmacy.  A home blood pressure monitor.  If you are 12-49 years old, ask your health care provider if you should take aspirin to prevent heart disease.  Diabetes screening involves taking a blood sample to check your fasting blood sugar level. This should be done once every 3 years after age 42 if you are at a normal weight and without risk factors for diabetes. Testing should be considered at a younger age or be carried out more frequently if you are overweight and have at least 1 risk factor for diabetes.  Colorectal cancer can be detected and often prevented. Most routine colorectal cancer screening begins at the age of 56 and continues through age 41. However, your health care provider may recommend screening at an earlier age if you have risk factors for colon cancer. On a yearly basis, your health care provider may provide home test kits to check for hidden blood in the stool. A small camera at the end of a tube may be used to directly examine the colon (sigmoidoscopy or colonoscopy) to detect the earliest forms of colorectal cancer. Talk to your health care provider about this at age 84 when routine screening begins. A direct exam of the colon should be repeated every 5-10 years through age 60, unless early forms of precancerous polyps or small growths are found.  People who are at an increased risk for hepatitis B should be screened for this virus. You are considered at high risk for hepatitis B if:  You were born in a country where hepatitis B occurs often. Talk with your health care provider about which countries are considered high risk.  Your parents were born in a high-risk country and you have not received a shot to protect against hepatitis B (hepatitis B vaccine).  You have HIV or AIDS.  You use needles to inject street drugs.  You live with, or have sex with, someone who has hepatitis B.  You are a  man who has sex with other men (MSM).  You get hemodialysis treatment.  You take certain medicines for conditions like cancer, organ transplantation, and autoimmune conditions.  Hepatitis C blood testing is recommended for all people born from [birth year] through 1967 and any individual with known risk factors for hepatitis C.  Healthy men should no longer receive prostate-specific antigen (PSA) blood tests as part of routine cancer screening. Talk to your health care provider about prostate cancer screening.  Testicular cancer screening is not recommended for adolescents or adult males who have no symptoms. Screening includes self-exam, a health care provider exam, and other screening tests. Consult with your health care provider about any symptoms you have or any concerns you have about testicular cancer.  Practice safe sex. Use condoms and avoid high-risk sexual practices to reduce the spread of  sexually transmitted infections (STIs).  You should be screened for STIs, including gonorrhea and chlamydia if:  You are sexually active and are younger than 24 years.  You are older than 24 years, and your health care provider tells you that you are at risk for this type of infection.  Your sexual activity has changed since you were last screened, and you are at an increased risk for chlamydia or gonorrhea. Ask your health care provider if you are at risk.  If you are at risk of being infected with HIV, it is recommended that you take a prescription medicine daily to prevent HIV infection. This is called pre-exposure prophylaxis (PrEP). You are considered at risk if:  You are a man who has sex with other men (MSM).  You are a heterosexual man who is sexually active with multiple partners.  You take drugs by injection.  You are sexually active with a partner who has HIV.  Talk with your health care provider about whether you are at high risk of being infected with HIV. If you choose to begin PrEP,  you should first be tested for HIV. You should then be tested every 3 months for as long as you are taking PrEP.  Use sunscreen. Apply sunscreen liberally and repeatedly throughout the day. You should seek shade when your shadow is shorter than you. Protect yourself by wearing long sleeves, pants, a wide-brimmed hat, and sunglasses year round whenever you are outdoors.  Tell your health care provider of new moles or changes in moles, especially if there is a change in shape or color. Also, tell your health care provider if a mole is larger than the size of a pencil eraser.  A one-time screening for abdominal aortic aneurysm (AAA) and surgical repair of large AAAs by ultrasound is recommended for men aged 78-75 years who are current or former smokers.  Stay current with your vaccines (immunizations).   This information is not intended to replace advice given to you by your health care provider. Make sure you discuss any questions you have with your health care provider.   Document Released: 04/21/2008 Document Revised: 11/14/2014 Document Reviewed: 03/21/2011 Elsevier Interactive Patient Education [employer].

## 2017-09-03 ENCOUNTER — Other Ambulatory Visit: Payer: Self-pay | Admitting: Family Medicine

## 2017-09-03 DIAGNOSIS — Z125 Encounter for screening for malignant neoplasm of prostate: Secondary | ICD-10-CM

## 2017-09-03 DIAGNOSIS — E7800 Pure hypercholesterolemia, unspecified: Secondary | ICD-10-CM

## 2017-09-04 ENCOUNTER — Ambulatory Visit (INDEPENDENT_AMBULATORY_CARE_PROVIDER_SITE_OTHER): Payer: Medicare Other

## 2017-09-04 VITALS — BP 94/70 | HR 68 | Temp 97.8°F | Ht 67.25 in | Wt 163.8 lb

## 2017-09-04 DIAGNOSIS — Z23 Encounter for immunization: Secondary | ICD-10-CM | POA: Diagnosis not present

## 2017-09-04 DIAGNOSIS — Z0000 Encounter for general adult medical examination without abnormal findings: Secondary | ICD-10-CM | POA: Diagnosis not present

## 2017-09-04 DIAGNOSIS — Z125 Encounter for screening for malignant neoplasm of prostate: Secondary | ICD-10-CM

## 2017-09-04 DIAGNOSIS — E7800 Pure hypercholesterolemia, unspecified: Secondary | ICD-10-CM

## 2017-09-04 LAB — COMPREHENSIVE METABOLIC PANEL
ALBUMIN: 4.2 g/dL (ref 3.5–5.2)
ALT: 19 U/L (ref 0–53)
AST: 20 U/L (ref 0–37)
Alkaline Phosphatase: 69 U/L (ref 39–117)
BUN: 14 mg/dL (ref 6–23)
CHLORIDE: 102 meq/L (ref 96–112)
CO2: 30 mEq/L (ref 19–32)
CREATININE: 0.97 mg/dL (ref 0.40–1.50)
Calcium: 9.6 mg/dL (ref 8.4–10.5)
GFR: 81.71 mL/min (ref >60.00)
Glucose, Bld: 105 mg/dL — ABNORMAL HIGH (ref 70–99)
Potassium: 4.5 mEq/L (ref 3.5–5.1)
SODIUM: 140 meq/L (ref 135–145)
TOTAL PROTEIN: 6.8 g/dL (ref 6.0–8.3)
Total Bilirubin: 0.8 mg/dL (ref 0.2–1.2)

## 2017-09-04 LAB — LIPID PANEL
CHOLESTEROL: 159 mg/dL (ref 0–200)
HDL: 36 mg/dL — ABNORMAL LOW (ref >39.00)
LDL Cholesterol: 95 mg/dL (ref 0–99)
NonHDL: 123.04
Total CHOL/HDL Ratio: 4
Triglycerides: 142 mg/dL (ref 0.0–149.0)
VLDL: 28.4 mg/dL (ref 0.0–40.0)

## 2017-09-04 LAB — PSA, MEDICARE: PSA: 2.37 ng/mL (ref 0.10–4.00)

## 2017-09-04 NOTE — Progress Notes (Signed)
PCP notes:   Health maintenance:  Flu vaccine - administered  Abnormal screenings:   Hearing - failed  Hearing Screening   125Hz  250Hz  500Hz  1000Hz  2000Hz  3000Hz  4000Hz  6000Hz  8000Hz   Right ear:   40 40 40  0    Left ear:   40 40 40  0     Patient concerns:   None  Nurse concerns:  None  Next PCP appt:   09/07/17 @ 1030

## 2017-09-04 NOTE — Progress Notes (Signed)
Subjective:   Bryan Barker is a 66 y.o. male who presents for Medicare Annual/Subsequent preventive examination.  Review of Systems:  N/A Cardiac Risk Factors include: advanced age (>38men, >7 women);dyslipidemia;male gender     Objective:    Vitals: BP 94/70 (BP Location: Right Arm, Patient Position: Sitting, Cuff Size: Normal)   Pulse 68   Temp 97.8 F (36.6 C) (Oral)   Ht 5' 7.25" (1.708 m) Comment: no shoes  Wt 163 lb 12 oz (74.3 kg)   SpO2 98%   BMI 25.46 kg/m   Body mass index is 25.46 kg/m.  Tobacco History  Smoking Status  . Former Smoker  . Packs/day: 1.00  . Years: 3.00  . Types: Cigarettes  . Quit date: 06/16/1968  Smokeless Tobacco  . Never Used    Comment: only smoked as a teenager     Counseling given: No   Past Medical History:  Diagnosis Date  . Dehydration 03/2011   ER for syncope attributed to dehydration  . Diverticulosis    by colonoscopy  . Hyperlipidemia    Past Surgical History:  Procedure Laterality Date  . 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 COLONOSCOPY  10/2014   sig diverticulosis, rpt 5 yrs Carlean Purl)  . 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 Diabetes Mother   . Hypertension Mother   . Heart disease Mother        Pacer  . Colon polyps Mother   . Coronary artery disease Father 47       MI  . Cancer Brother        esoph, mets to brain  . Diabetes Brother   . Alcohol abuse Sister   . Colon cancer Neg Hx   . Stroke Neg Hx    History  Sexual Activity  . Sexual activity: Yes    Outpatient Encounter Prescriptions as of 09/04/2017  Medication Sig  . aspirin 81 MG tablet Take 81 mg by mouth daily.  . Omega-3 Fatty Acids (FISH OIL) 1000 MG CAPS Take by mouth daily.    . pravastatin (PRAVACHOL) 40 MG tablet Take 1 tablet (40 mg total) by mouth daily.   No  facility-administered encounter medications on file as of 09/04/2017.     Activities of Daily Living In your present state of health, do you have any difficulty performing the following activities: 09/04/2017  Hearing? N  Vision? N  Difficulty concentrating or making decisions? N  Walking or climbing stairs? N  Dressing or bathing? N  Doing errands, shopping? N  Preparing Food and eating ? N  Using the Toilet? N  In the past six months, have you accidently leaked urine? N  Do you have problems with loss of bowel control? N  Managing your Medications? N  Managing your Finances? N  Housekeeping or managing your Housekeeping? N  Some recent data might be hidden    Patient Care Team: Ria Bush, MD as PCP - General (Family Medicine) Eula Listen, DDS as Referring Physician (Dentistry) Gatha Mayer, MD as Consulting Physician (Gastroenterology)   Assessment:     Hearing Screening   125Hz  250Hz  500Hz  1000Hz  2000Hz  3000Hz  4000Hz  6000Hz  8000Hz   Right ear:   40 40 40  0    Left ear:   40 40 40  0    Vision  Screening Comments: Last vision exam in 2018 @ [employer] and Dietary recommendations Current Exercise Habits: The patient has a physically strenous job, but has no regular exercise apart from work. (works on farm 4+ hrs daily), Exercise limited by: None identified  Goals    . 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      Fall Risk Fall Risk  09/04/2017 08/25/2016 09/08/2015 08/21/2014  Falls in the past year? No No No No   Depression Screen PHQ 2/9 Scores 09/04/2017 08/25/2016 09/08/2015 08/21/2014  PHQ - 2 Score 0 0 0 0  PHQ- 9 Score 0 - - -    Cognitive Function MMSE - Mini Mental State Exam 09/04/2017 08/25/2016  Orientation to time 5 5  Orientation to Place 5 5  Registration 3 3  Attention/ Calculation 0 0  Recall 3 3  Language- name 2 objects 0 0   Language- repeat 1 1  Language- follow 3 step command 3 3  Language- read & follow direction 0 0  Write a sentence 0 0  Copy design 0 0  Total score 20 20       PLEASE NOTE: A Mini-Cog screen was completed. Maximum score is 20. A value of 0 denotes this part of Folstein MMSE was not completed or the patient failed this part of the Mini-Cog screening.   Mini-Cog Screening Orientation to Time - Max 5 pts Orientation to Place - Max 5 pts Registration - Max 3 pts Recall - Max 3 pts Language Repeat - Max 1 pts Language Follow 3 Step Command - Max 3 pts   Immunization History  Administered Date(s) Administered  . Influenza Split 08/23/2011, 08/21/2012  . Influenza,inj,Quad PF,6+ Mos 08/20/2013, 08/21/2014, 09/08/2015, 08/25/2016, 09/04/2017  . Pneumococcal Conjugate-13 08/21/2014  . Pneumococcal Polysaccharide-23 09/08/2015  . Td 06/17/2005, 08/25/2016  . Zoster 06/28/2012   Screening Tests Health Maintenance  Topic Date Due  . DTaP/Tdap/Td (1 - Tdap) 08/25/2026 (Originally 08/26/2016)  . COLONOSCOPY  10/17/2019  . TETANUS/TDAP  08/25/2026  . INFLUENZA VACCINE  Completed  . Hepatitis C Screening  Completed  . PNA vac Low Risk Adult  Completed      Plan:     I have personally reviewed, addressed, and noted the following in the patient's chart:  A. Medical and social history B. Use of alcohol, tobacco or illicit drugs  C. Current medications and supplements D. Functional ability and status E.  Nutritional status F.  Physical activity G. Advance directives H. List of other physicians I.  Hospitalizations, surgeries, and ER visits in previous 12 months J.  [place] to include hearing, vision, cognitive, depression L. Referrals and appointments - none  In addition, I have reviewed and discussed with patient certain preventive protocols, quality metrics, and best practice recommendations. A written personalized care plan for preventive services as well as  general preventive health recommendations were provided to patient.  See attached scanned questionnaire for additional information.   Signed,   Lindell Noe, MHA, BS, LPN Health Coach

## 2017-09-04 NOTE — Patient Instructions (Signed)
Bryan Barker , Thank you for taking time to come for your Medicare Wellness Visit. I appreciate your ongoing commitment to your health goals. Please review the following plan we discussed and let me know if I can assist you in the future.   These are the goals we discussed: Goals    . 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       This is a list of the screening recommended for you and due dates:  Health Maintenance  Topic Date Due  . DTaP/Tdap/Td vaccine (1 - Tdap) 08/25/2026*  . Colon Cancer Screening  10/17/2019  . Tetanus Vaccine  08/25/2026  . Flu Shot  Completed  .  Hepatitis C: One time screening is recommended by Center for Disease Control  (CDC) for  adults born from [birth year] through 1967.   Completed  . Pneumonia vaccines  Completed  *Topic was postponed. The date shown is not the original due date.   Preventive Care for Adults  A healthy lifestyle and preventive care can promote health and wellness. Preventive health guidelines for adults include the following key practices.  . A routine yearly physical is a good way to check with your health care provider about your health and preventive screening. It is a chance to share any concerns and updates on your health and to receive a thorough exam.  . Visit your dentist for a routine exam and preventive care every 6 months. Brush your teeth twice a day and floss once a day. Good oral hygiene prevents tooth decay and gum disease.  . The frequency of eye exams is based on your age, health, family medical history, use  of contact lenses, and other factors. Follow your health care provider's recommendations for frequency of eye exams.  . Eat a healthy diet. Foods like vegetables, fruits, whole grains, low-fat dairy products, and lean protein foods contain the nutrients you need without too many calories. Decrease your intake of foods high in solid fats, added sugars,  and salt. Eat the right amount of calories for you. Get information about a proper diet from your health care provider, if necessary.  . Regular physical exercise is one of the most important things you can do for your health. Most adults should get at least 150 minutes of moderate-intensity exercise (any activity that increases your heart rate and causes you to sweat) each week. In addition, most adults need muscle-strengthening exercises on 2 or more days a week.  Silver Sneakers may be a benefit available to you. To determine eligibility, you may visit the website: www.silversneakers.com or contact program at (307)250-5041 Mon-Fri between 8AM-8PM.   . Maintain a healthy weight. The body mass index (BMI) is a screening tool to identify possible weight problems. It provides an estimate of body fat based on height and weight. Your health care provider can find your BMI and can help you achieve or maintain a healthy weight.   For adults 20 years and older: ? A BMI below 18.5 is considered underweight. ? A BMI of 18.5 to 24.9 is normal. ? A BMI of 25 to 29.9 is considered overweight. ? A BMI of 30 and above is considered obese.   . Maintain normal blood lipids and cholesterol levels by exercising and minimizing your intake of saturated fat. Eat a balanced diet with plenty of fruit and vegetables. Blood tests for lipids and cholesterol  should begin at age 49 and be repeated every 5 years. If your lipid or cholesterol levels are high, you are over 50, or you are at high risk for heart disease, you may need your cholesterol levels checked more frequently. Ongoing high lipid and cholesterol levels should be treated with medicines if diet and exercise are not working.  . If you smoke, find out from your health care provider how to quit. If you do not use tobacco, please do not start.  . If you choose to drink alcohol, please do not consume more than 2 drinks per day. One drink is considered to be 12  ounces (355 mL) of beer, 5 ounces (148 mL) of wine, or 1.5 ounces (44 mL) of liquor.  . If you are 71-50 years old, ask your health care provider if you should take aspirin to prevent strokes.  . Use sunscreen. Apply sunscreen liberally and repeatedly throughout the day. You should seek shade when your shadow is shorter than you. Protect yourself by wearing long sleeves, pants, a wide-brimmed hat, and sunglasses year round, whenever you are outdoors.  . Once a month, do a whole body skin exam, using a mirror to look at the skin on your back. Tell your health care provider of new moles, moles that have irregular borders, moles that are larger than a pencil eraser, or moles that have changed in shape or color.

## 2017-09-04 NOTE — Progress Notes (Signed)
Pre visit review using our clinic review tool, if applicable. No additional management support is needed unless otherwise documented below in the visit note. 

## 2017-09-07 ENCOUNTER — Encounter: Payer: Self-pay | Admitting: Family Medicine

## 2017-09-07 ENCOUNTER — Ambulatory Visit (INDEPENDENT_AMBULATORY_CARE_PROVIDER_SITE_OTHER): Payer: Medicare Other | Admitting: Family Medicine

## 2017-09-07 VITALS — BP 120/70 | HR 78 | Temp 97.8°F | Ht 67.0 in | Wt 162.0 lb

## 2017-09-07 DIAGNOSIS — Z0000 Encounter for general adult medical examination without abnormal findings: Secondary | ICD-10-CM

## 2017-09-07 DIAGNOSIS — Z0001 Encounter for general adult medical examination with abnormal findings: Secondary | ICD-10-CM | POA: Diagnosis not present

## 2017-09-07 DIAGNOSIS — E7800 Pure hypercholesterolemia, unspecified: Secondary | ICD-10-CM | POA: Diagnosis not present

## 2017-09-07 DIAGNOSIS — K644 Residual hemorrhoidal skin tags: Secondary | ICD-10-CM

## 2017-09-07 MED ORDER — HYDROCORTISONE 2.5 % RE CREA
1.0000 | TOPICAL_CREAM | Freq: Two times a day (BID) | RECTAL | 0 refills | Status: DC
Start: 2017-09-07 — End: 2018-09-17

## 2017-09-07 MED ORDER — PRAVASTATIN SODIUM 40 MG PO TABS: 40.0000 mg | ORAL_TABLET | Freq: Every day | ORAL | 3 refills | Status: DC

## 2017-09-07 NOTE — Assessment & Plan Note (Signed)
Preventative protocols reviewed and updated unless pt declined. Discussed healthy diet and lifestyle.  

## 2017-09-07 NOTE — Assessment & Plan Note (Signed)
Recurrent, currently inflamed and bothersome but not thrombosed. Rx anusol cream.

## 2017-09-07 NOTE — Progress Notes (Signed)
BP 120/70 (BP Location: Left Arm, Patient Position: Sitting, Cuff Size: Normal)   Pulse 78   Temp 97.8 F (36.6 C) (Oral)   Ht 5\' 7"  (1.702 m)   Wt 162 lb (73.5 kg)   SpO2 97%   BMI 25.37 kg/m    CC: CPE Subjective:    Patient ID: Bryan Barker, male    DOB: [birth year], 66 y.o.   MRN: [id number]  HPI: Bryan Barker is a 66 y.o. male presenting on 09/07/2017 for Annual Exam (Pt 2)   Saw Katha Cabal this week for medicare wellness visit. Note reviewed.    Preventative: COLONOSCOPY Date: 10/2014 sig diverticulosis, rpt 5 yrs Carlean Purl)  Prostate screening - PSA has decreased again. Gets yearly checks. No nocturia. Strong stream.  Flu shot yearly prevnar 08/2014, pneumovax 2016 Tetanus 08/2016 zostavax - 06/2012  shingrix - discussed Advanced directives: Discussed. Would think wife or oldest daughter be HCPOA. Encouraged to fill out packet provided last year Seat belt use discussed.  Sunscreen use discussed. Denies changing moles.  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 Interim medical history since our last visit reviewed. Allergies and medications reviewed and updated. Outpatient Medications Prior to Visit  Medication Sig Dispense Refill  . aspirin 81 MG tablet Take 81 mg by mouth daily.    . Omega-3 Fatty Acids (FISH OIL) 1000 MG CAPS Take by mouth daily.      . pravastatin (PRAVACHOL) 40 MG tablet Take 1 tablet (40 mg total) by mouth daily. 90 tablet 3   No facility-administered medications prior to visit.      Per HPI unless specifically indicated in ROS section below Review of Systems  Constitutional:  Negative for activity change, appetite change, chills, fatigue, fever and unexpected weight change.  HENT: Negative for hearing loss.   Eyes: Negative for visual disturbance.  Respiratory: Negative for cough, chest tightness, shortness of breath and wheezing.   Cardiovascular: Negative for chest pain, palpitations and leg swelling.  Gastrointestinal: Positive for blood in stool (with wiping - from hemorrhoid). Negative for abdominal distention, abdominal pain, constipation, diarrhea, nausea and vomiting.  Genitourinary: Negative for difficulty urinating and hematuria.  Musculoskeletal: Negative for arthralgias, myalgias and neck pain.  Skin: Negative for rash.  Neurological: Negative for dizziness, seizures, syncope and headaches.  Hematological: Negative for adenopathy. Does not bruise/bleed easily.  Psychiatric/Behavioral: Negative for dysphoric mood. The patient is not nervous/anxious.        Objective:    BP 120/70 (BP Location: Left Arm, Patient Position: Sitting, Cuff Size: Normal)   Pulse 78   Temp 97.8 F (36.6 C) (Oral)   Ht 5\' 7"  (1.702 m)   Wt 162 lb (73.5 kg)   SpO2 97%   BMI 25.37 kg/m   Wt Readings from Last 3 Encounters:  09/07/17 162 lb (73.5 kg)  09/04/17 163 lb 12 oz (74.3 kg)  09/01/16 162 lb (73.5 kg)    Physical Exam  Constitutional: He is oriented to person, place, and time. He appears well-developed and well-nourished. No distress.  HENT:  Head: Normocephalic and atraumatic.  Right Ear: Hearing, tympanic membrane, external ear and ear canal normal.  Left Ear: Hearing, tympanic  membrane, external ear and ear canal normal.  Nose: Nose normal.  Mouth/Throat: Uvula is midline, oropharynx is clear and moist and mucous membranes are normal. No oropharyngeal exudate, posterior oropharyngeal edema or posterior oropharyngeal erythema.  Eyes: Pupils are equal, round, and reactive to light. Conjunctivae and EOM are normal. No scleral icterus.  Neck: Normal range of  motion. Neck supple. Carotid bruit is not present. No thyromegaly present.  Cardiovascular: Normal rate, regular rhythm, normal heart sounds and intact distal pulses.   No murmur heard. Pulses:      Radial pulses are 2+ on the right side, and 2+ on the left side.  Pulmonary/Chest: Effort normal and breath sounds normal. No respiratory distress. He has no wheezes. He has no rales.  Abdominal: Soft. Bowel sounds are normal. He exhibits no distension and no mass. There is no tenderness. There is no rebound and no guarding.  Genitourinary: Prostate normal. Rectal exam shows external hemorrhoid (R sided mildly inflamed). Rectal exam shows no fissure, no mass, no tenderness and anal tone normal. Prostate is not enlarged (20g) and not tender.  Musculoskeletal: Normal range of motion. He exhibits no edema.  Lymphadenopathy:    He has no cervical adenopathy.  Neurological: He is alert and oriented to person, place, and time.  CN grossly intact, station and gait intact  Skin: Skin is warm and dry. No rash noted.  Psychiatric: He has a normal mood and affect. His behavior is normal. Judgment and thought content normal.  Nursing note and vitals reviewed.  Results for orders placed or performed in visit on 09/04/17  Lipid panel  Result Value Ref Range   Cholesterol 159 0 - 200 mg/dL   Triglycerides 142.0 0.0 - 149.0 mg/dL   HDL 36.00 (L) >39.00 mg/dL   VLDL 28.4 0.0 - 40.0 mg/dL   LDL Cholesterol 95 0 - 99 mg/dL   Total CHOL/HDL Ratio 4    NonHDL 123.04   Comprehensive metabolic panel  Result Value Ref Range   Sodium 140 135 - 145 mEq/L   Potassium 4.5 3.5 - 5.1 mEq/L   Chloride 102 96 - 112 mEq/L   CO2 30 19 - 32 mEq/L   Glucose, Bld 105 (H) 70 - 99 mg/dL   BUN 14 6 - 23 mg/dL   Creatinine, Ser 0.97 0.40 - 1.50 mg/dL   Total Bilirubin 0.8 0.2 - 1.2 mg/dL   Alkaline Phosphatase 69 39 - 117 U/L   AST 20 0 - 37 U/L   ALT 19 0 - 53 U/L   Total Protein 6.8 6.0 - 8.3 g/dL   Albumin 4.2 3.5 -  5.2 g/dL   Calcium 9.6 8.4 - 10.5 mg/dL   GFR 81.71 >60.00 mL/min  PSA, Medicare  Result Value Ref Range   PSA 2.37 0.10 - 4.00 ng/ml      Assessment & Plan:   Problem List Items Addressed This Visit    External hemorrhoids    Recurrent, currently inflamed and bothersome but not thrombosed. Rx anusol cream.       Relevant Medications   pravastatin (PRAVACHOL) 40 MG tablet   Healthcare maintenance - Primary    Preventative protocols reviewed and updated unless pt declined. Discussed healthy diet and lifestyle.       Hypercholesteremia    Chronic, stable. Continue current regimen (pravastatin, fish oil) The 10-year ASCVD risk score Mikey Bussing DC Jr., et al., 2013) is: 14.6%   Values used to calculate the score:     Age: 73 years  Sex: Male     Is Non-Hispanic African American: No     Diabetic: No     Tobacco smoker: No     Systolic Blood Pressure: 035 mmHg     Is BP treated: No     HDL Cholesterol: 36 mg/dL     Total Cholesterol: 159 mg/dL       Relevant Medications   pravastatin (PRAVACHOL) 40 MG tablet       Follow up plan: Return in about 1 year (around 09/07/2018) for medicare wellness visit, annual exam, prior fasting for blood work.  Ria Bush, MD

## 2017-09-07 NOTE — Patient Instructions (Addendum)
If interested, check with pharmacy about new 2 shot shingles series (shingrix).  You are doing well today.  Try anusol cream for hemorrhoid.  Continue pravastatin.  Return as needed or in 1 year for next physical.  Health Maintenance, Male A healthy lifestyle and preventive care is important for your health and wellness. Ask your health care provider about what schedule of regular examinations is right for you. What should I know about weight and diet? Eat a Healthy Diet  Eat plenty of vegetables, fruits, whole grains, low-fat dairy products, and lean protein.  Do not eat a lot of foods high in solid fats, added sugars, or salt.  Maintain a Healthy Weight Regular exercise can help you achieve or maintain a healthy weight. You should:  Do at least 150 minutes of exercise each week. The exercise should increase your heart rate and make you sweat (moderate-intensity exercise).  Do strength-training exercises at least twice a week.  Watch Your Levels of Cholesterol and Blood Lipids  Have your blood tested for lipids and cholesterol every 5 years starting at 68 years of age. If you are at high risk for heart disease, you should start having your blood tested when you are 68 years old. You may need to have your cholesterol levels checked more often if: ? Your lipid or cholesterol levels are high. ? You are older than 68 years of age. ? You are at high risk for heart disease.  What should I know about cancer screening? Many types of cancers can be detected early and may often be prevented. Lung Cancer  You should be screened every year for lung cancer if: ? You are a current smoker who has smoked for at least 30 years. ? You are a former smoker who has quit within the past 15 years.  Talk to your health care provider about your screening options, when you should start screening, and how often you should be screened.  Colorectal Cancer  Routine colorectal cancer screening usually  begins at 68 years of age and should be repeated every 5-10 years until you are 68 years old. You may need to be screened more often if early forms of precancerous polyps or small growths are found. Your health care provider may recommend screening at an earlier age if you have risk factors for colon cancer.  Your health care provider may recommend using home test kits to check for hidden blood in the stool.  A small camera at the end of a tube can be used to examine your colon (sigmoidoscopy or colonoscopy). This checks for the earliest forms of colorectal cancer.  Prostate and Testicular Cancer  Depending on your age and overall health, your health care provider may do certain tests to screen for prostate and testicular cancer.  Talk to your health care provider about any symptoms or concerns you have about testicular or prostate cancer.  Skin Cancer  Check your skin from head to toe regularly.  Tell your health care provider about any new moles or changes in moles, especially if: ? There is a change in a mole's size, shape, or color. ? You have a mole that is larger than a pencil eraser.  Always use sunscreen. Apply sunscreen liberally and repeat throughout the day.  Protect yourself by wearing long sleeves, pants, a wide-brimmed hat, and sunglasses when outside.  What should I know about heart disease, diabetes, and high blood pressure?  If you are 60-23 years of age, have your blood pressure  checked every 3-5 years. If you are 67 years of age or older, have your blood pressure checked every year. You should have your blood pressure measured twice-once when you are at a hospital or clinic, and once when you are not at a hospital or clinic. Record the average of the two measurements. To check your blood pressure when you are not at a hospital or clinic, you can use: ? An automated blood pressure machine at a pharmacy. ? A home blood pressure monitor.  Talk to your health care  provider about your target blood pressure.  If you are between 83-48 years old, ask your health care provider if you should take aspirin to prevent heart disease.  Have regular diabetes screenings by checking your fasting blood sugar level. ? If you are at a normal weight and have a low risk for diabetes, have this test once every three years after the age of 5. ? If you are overweight and have a high risk for diabetes, consider being tested at a younger age or more often.  A one-time screening for abdominal aortic aneurysm (AAA) by ultrasound is recommended for men aged 88-75 years who are current or former smokers. What should I know about preventing infection? Hepatitis B If you have a higher risk for hepatitis B, you should be screened for this virus. Talk with your health care provider to find out if you are at risk for hepatitis B infection. Hepatitis C Blood testing is recommended for:  Everyone born from [birth year] through 1965.  Anyone with known risk factors for hepatitis C.  Sexually Transmitted Diseases (STDs)  You should be screened each year for STDs including gonorrhea and chlamydia if: ? You are sexually active and are younger than 68 years of age. ? You are older than 68 years of age and your health care provider tells you that you are at risk for this type of infection. ? Your sexual activity has changed since you were last screened and you are at an increased risk for chlamydia or gonorrhea. Ask your health care provider if you are at risk.  Talk with your health care provider about whether you are at high risk of being infected with HIV. Your health care provider may recommend a prescription medicine to help prevent HIV infection.  What else can I do?  Schedule regular health, dental, and eye exams.  Stay current with your vaccines (immunizations).  Do not use any tobacco products, such as cigarettes, chewing tobacco, and e-cigarettes. If you need help quitting, ask  your health care provider.  Limit alcohol intake to no more than 2 drinks per day. One drink equals 12 ounces of beer, 5 ounces of wine, or 1 ounces of hard liquor.  Do not use street drugs.  Do not share needles.  Ask your health care provider for help if you need support or information about quitting drugs.  Tell your health care provider if you often feel depressed.  Tell your health care provider if you have ever been abused or do not feel safe at home. This information is not intended to replace advice given to you by your health care provider. Make sure you discuss any questions you have with your health care provider. Document Released: 04/21/2008 Document Revised: 06/22/2016 Document Reviewed: 07/28/2015 Elsevier Interactive Patient Education  [employer].

## 2017-09-07 NOTE — Assessment & Plan Note (Signed)
Chronic, stable. Continue current regimen (pravastatin, fish oil) The 10-year ASCVD risk score Bryan Bussing DC Jr., et al., 2013) is: 14.6%   Values used to calculate the score:     Age: 66 years     Sex: Male     Is Non-Hispanic African American: No     Diabetic: No     Tobacco smoker: No     Systolic Blood Pressure: 197 mmHg     Is BP treated: No     HDL Cholesterol: 36 mg/dL     Total Cholesterol: 159 mg/dL

## 2017-09-13 NOTE — Progress Notes (Signed)
I reviewed health advisor's note, was available for consultation, and agree with documentation and plan.  

## 2017-11-07 DIAGNOSIS — H269 Unspecified cataract: Secondary | ICD-10-CM

## 2017-11-07 HISTORY — DX: Unspecified cataract: H26.9

## 2018-09-03 ENCOUNTER — Other Ambulatory Visit: Payer: Self-pay | Admitting: Family Medicine

## 2018-09-03 DIAGNOSIS — E7800 Pure hypercholesterolemia, unspecified: Secondary | ICD-10-CM

## 2018-09-03 DIAGNOSIS — Z125 Encounter for screening for malignant neoplasm of prostate: Secondary | ICD-10-CM

## 2018-09-05 ENCOUNTER — Ambulatory Visit: Payer: Medicare Other

## 2018-09-05 ENCOUNTER — Ambulatory Visit (INDEPENDENT_AMBULATORY_CARE_PROVIDER_SITE_OTHER): Payer: Medicare Other

## 2018-09-05 VITALS — BP 104/70 | HR 64 | Temp 97.8°F | Ht 67.5 in | Wt 161.5 lb

## 2018-09-05 DIAGNOSIS — Z125 Encounter for screening for malignant neoplasm of prostate: Secondary | ICD-10-CM

## 2018-09-05 DIAGNOSIS — Z23 Encounter for immunization: Secondary | ICD-10-CM | POA: Diagnosis not present

## 2018-09-05 DIAGNOSIS — E7800 Pure hypercholesterolemia, unspecified: Secondary | ICD-10-CM | POA: Diagnosis not present

## 2018-09-05 DIAGNOSIS — Z0000 Encounter for general adult medical examination without abnormal findings: Secondary | ICD-10-CM | POA: Diagnosis not present

## 2018-09-05 LAB — COMPREHENSIVE METABOLIC PANEL
ALT: 13 U/L (ref 0–53)
AST: 17 U/L (ref 0–37)
Albumin: 4.2 g/dL (ref 3.5–5.2)
Alkaline Phosphatase: 70 U/L (ref 39–117)
BUN: 15 mg/dL (ref 6–23)
CALCIUM: 9.5 mg/dL (ref 8.4–10.5)
CHLORIDE: 103 meq/L (ref 96–112)
CO2: 31 meq/L (ref 19–32)
Creatinine, Ser: 1.03 mg/dL (ref 0.40–1.50)
GFR: 76.02 mL/min (ref >60.00)
GLUCOSE: 103 mg/dL — AB (ref 70–99)
POTASSIUM: 4.3 meq/L (ref 3.5–5.1)
Sodium: 140 mEq/L (ref 135–145)
Total Bilirubin: 0.6 mg/dL (ref 0.2–1.2)
Total Protein: 6.8 g/dL (ref 6.0–8.3)

## 2018-09-05 LAB — LIPID PANEL
Cholesterol: 137 mg/dL (ref 0–200)
HDL: 36.3 mg/dL — ABNORMAL LOW (ref >39.00)
LDL CALC: 78 mg/dL (ref 0–99)
NonHDL: 100.56
TRIGLYCERIDES: 113 mg/dL (ref 0.0–149.0)
Total CHOL/HDL Ratio: 4
VLDL: 22.6 mg/dL (ref 0.0–40.0)

## 2018-09-05 LAB — PSA, MEDICARE: PSA: 1.68 ng/mL (ref 0.10–4.00)

## 2018-09-05 NOTE — Progress Notes (Signed)
Subjective:   CLEARNCE LEJA is a 67 y.o. male who presents for Medicare Annual/Subsequent preventive examination.  Review of Systems:  N/A Cardiac Risk Factors include: advanced age (>34men, >74 women);dyslipidemia;male gender     Objective:    Vitals: BP 104/70 (BP Location: Right Arm, Patient Position: Sitting, Cuff Size: Normal)   Pulse 64   Temp 97.8 F (36.6 C) (Oral)   Ht 5' 7.5" (1.715 m) Comment: shoes  Wt 161 lb 8 oz (73.3 kg)   SpO2 98%   BMI 24.92 kg/m   Body mass index is 24.92 kg/m.  Advanced Directives 09/05/2018 09/04/2017 08/25/2016  Does Patient Have a Medical Advance Directive? No No No  Would patient like information on creating a medical advance directive? Yes (MAU/Ambulatory/Procedural Areas - Information given) No - Patient declined Yes - Educational materials given    Tobacco Social History   Tobacco Use  Smoking Status Former Smoker  . Packs/day: 1.00  . Years: 3.00  . Pack years: 3.00  . Types: Cigarettes  . Last attempt to quit: 06/16/1968  . Years since quitting: 50.2  Smokeless Tobacco Never Used  Tobacco Comment   only smoked as a teenager     Counseling given: No Comment: only smoked as a teenager   Clinical Intake:  Pre-visit preparation completed: Yes  Pain : No/denies pain Pain Score: 0-No pain     Nutritional Status: BMI 25 -29 Overweight Nutritional Risks: None Diabetes: No  How often do you need to have someone help you when you read instructions, pamphlets, or other written materials from your doctor or pharmacy?: 1 - Never What is the last grade level you completed in school?: 12th grade  Interpreter Needed?: No  Comments: pt lives with spouse Information entered by :: LPinson, LPN  Past Medical History:  Diagnosis Date  . Cataract 2019   bilateral eyes  . Dehydration 03/2011   ER for syncope attributed to dehydration  . Diverticulosis    by colonoscopy  . Hyperlipidemia    Past Surgical History:    Procedure Laterality Date  . 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 COLONOSCOPY  10/2014   sig diverticulosis, rpt 5 yrs Carlean Purl)  . 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 Diabetes Mother   . Hypertension Mother   . Heart disease Mother        Pacer  . Colon polyps Mother   . Coronary artery disease Father 7       MI  . Cancer Brother        esoph, mets to brain  . Diabetes Brother   . Alcohol abuse Sister   . Colon cancer Neg Hx   . Stroke Neg Hx    Social History   Socioeconomic History  . Marital status: Married    Spouse name: Not on file  . Number of children: 2  . Years of education: Not on file  . Highest education level: Not on file  Occupational History  . 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 Financial resource strain: Not on file  . Food insecurity:    Worry: Not on file    Inability: Not on file  . Transportation needs:    Medical: Not on file    Non-medical:  Not on file  Tobacco Use  . Smoking status: Former Smoker    Packs/day: 1.00    Years: 3.00    Pack years: 3.00    Types: Cigarettes    Last attempt to quit: 06/16/1968    Years since quitting: 50.2  . Smokeless tobacco: Never Used  . Tobacco comment: only smoked as a teenager  Substance and Sexual Activity  . Alcohol use: No    Alcohol/week: 0.0 standard drinks  . Drug use: No  . Sexual activity: Yes  Lifestyle  . Physical activity:    Days per week: Not on file    Minutes per session: Not on file  . Stress: Not on file  Relationships  . Social connections:    Talks on phone: Not on file    Gets together: Not on file    Attends religious service: Not on file    Active member of club or organization: Not on file    Attends meetings of clubs or organizations: Not on file     Relationship status: Not on file  Other Topics Concern  . 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 aspirin 81 MG tablet Take 81 mg by mouth daily.  . hydrocortisone (ANUSOL-HC) 2.5 % rectal cream Place 1 application rectally 2 (two) times daily.  . Omega-3 Fatty Acids (FISH OIL) 1000 MG CAPS Take by mouth daily.    . pravastatin (PRAVACHOL) 40 MG tablet Take 1 tablet (40 mg total) by mouth daily.   No facility-administered encounter medications on file as of 09/05/2018.     Activities of Daily Living In your present state of health, do you have any difficulty performing the following activities: 09/05/2018  Hearing? N  Vision? N  Difficulty concentrating or making decisions? N  Walking or climbing stairs? N  Dressing or bathing? N  Doing errands, shopping? N  Preparing Food and eating ? N  Using the Toilet? N  In the past six months, have you accidently leaked urine? N  Do you have problems with loss of bowel control? N  Managing your Medications? N  Managing your Finances? N  Housekeeping or managing your Housekeeping? N  Some recent data might be hidden    Patient Care Team: Ria Bush, MD as PCP - General (Family Medicine) Eula Listen, DDS as Referring Physician (Dentistry) Gatha Mayer, MD as Consulting Physician (Gastroenterology)   Assessment:   This is a routine wellness examination for [place].   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        Fall Risk Fall Risk  09/05/2018 09/04/2017 08/25/2016 09/08/2015 08/21/2014  Falls in the past year? No No No No No    Depression Screen PHQ 2/9 Scores 09/05/2018 09/04/2017 08/25/2016 09/08/2015  PHQ - 2 Score 0 0 0 0  PHQ- 9 Score 0 0 - -    Cognitive Function MMSE - Mini Mental State Exam 09/05/2018 09/04/2017 08/25/2016  Orientation to time 5 5 5   Orientation to Place 5 5 5   Registration 3 3 3   Attention/ Calculation 0 0 0  Recall 3 3 3   Language- name 2 objects 0 0 0  Language- repeat 1 1 1   Language- follow 3 step command 3 3 3   Language- read & follow direction 0 0 0  Write a sentence 0 0 0  Copy design 0 0 0  Total score 20 20 20      PLEASE NOTE: A Mini-Cog screen was completed. Maximum score is 20. A value of 0 denotes this part of Folstein MMSE was not completed or the patient failed this part of the Mini-Cog screening.   Mini-Cog Screening Orientation to Time - Max 5 pts Orientation to Place - Max 5 pts Registration - Max 3 pts Recall - Max 3 pts Language Repeat - Max 1 pts Language Follow 3 Step Command - Max 3 pts     Immunization History  Administered Date(s) Administered  . Influenza Split 08/23/2011, 08/21/2012  . Influenza,inj,Quad PF,6+ Mos 08/20/2013, 08/21/2014, 09/08/2015, 08/25/2016, 09/04/2017, 09/05/2018  . Pneumococcal Conjugate-13 08/21/2014  . Pneumococcal Polysaccharide-23 09/08/2015  . Td 06/17/2005, 08/25/2016  . Zoster 06/28/2012    Screening Tests Health Maintenance  Topic Date Due  . DTaP/Tdap/Td (1 - Tdap) 08/25/2026 (Originally 08/26/2016)  . COLONOSCOPY  10/17/2019  .  TETANUS/TDAP  08/25/2026  . INFLUENZA VACCINE  Completed  . Hepatitis C Screening  Completed  . PNA vac Low Risk Adult  Completed      Plan:    I have personally reviewed, addressed, and noted the following in the patient's chart:  A. Medical and social history B. Use of alcohol, tobacco or illicit drugs  C. Current medications and supplements D. Functional ability and status E.  Nutritional status F.  Physical activity G. Advance directives H. List of other physicians I.  Hospitalizations, surgeries, and ER visits in previous 12 months J.  [place] to include hearing, vision, cognitive, depression L. Referrals and appointments - none  In addition, I have reviewed and discussed with patient certain preventive protocols, quality metrics, and best practice recommendations. A written personalized care plan for preventive services as well as general preventive health recommendations were provided to patient.  See attached scanned questionnaire for additional information.   Signed,   Lindell Noe, MHA, BS, LPN Health Coach

## 2018-09-05 NOTE — Patient Instructions (Signed)
Mr. Oberhaus , Thank you for taking time to come for your Medicare Wellness Visit. I appreciate your ongoing commitment to your health goals. Please review the following plan we discussed and let me know if I can assist you in the future.   These are the goals we discussed: Goals    . 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        This is a list of the screening recommended for you and due dates:  Health Maintenance  Topic Date Due  . DTaP/Tdap/Td vaccine (1 - Tdap) 08/25/2026*  . Colon Cancer Screening  10/17/2019  . Tetanus Vaccine  08/25/2026  . Flu Shot  Completed  .  Hepatitis C: One time screening is recommended by Center for Disease Control  (CDC) for  adults born from [birth year] through 1967.   Completed  . Pneumonia vaccines  Completed  *Topic was postponed. The date shown is not the original due date.   Preventive Care for Adults  A healthy lifestyle and preventive care can promote health and wellness. Preventive health guidelines for adults include the following key practices.  . A routine yearly physical is a good way to check with your health care provider about your health and preventive screening. It is a chance to share any concerns and updates on your health and to receive a thorough exam.  . Visit your dentist for a routine exam and preventive care every 6 months. Brush your teeth twice a day and floss once a day. Good oral hygiene prevents tooth decay and gum disease.  . The frequency of eye exams is based on your age, health, family medical history, use  of contact lenses, and other factors. Follow your health care provider's recommendations for frequency of eye exams.  . Eat a healthy diet. Foods like vegetables, fruits, whole grains, low-fat dairy products, and lean protein foods contain the nutrients you need without too many calories. Decrease your intake of foods high in solid fats, added  sugars, and salt. Eat the right amount of calories for you. Get information about a proper diet from your health care provider, if necessary.  . Regular physical exercise is one of the most important things you can do for your health. Most adults should get at least 150 minutes of moderate-intensity exercise (any activity that increases your heart rate and causes you to sweat) each week. In addition, most adults need muscle-strengthening exercises on 2 or more days a week.  Silver Sneakers may be a benefit available to you. To determine eligibility, you may visit the website: www.silversneakers.com or contact program at 418-368-8608 Mon-Fri between 8AM-8PM.   . Maintain a healthy weight. The body mass index (BMI) is a screening tool to identify possible weight problems. It provides an estimate of body fat based on height and weight. Your health care provider can find your BMI and can help you achieve or maintain a healthy weight.   For adults 20 years and older: ? A BMI below 18.5 is considered underweight. ? A BMI of 18.5 to 24.9 is normal. ? A BMI of 25 to 29.9 is considered overweight. ? A BMI of 30 and above is considered obese.   . Maintain normal blood lipids and cholesterol levels by exercising and minimizing your intake of saturated fat. Eat a balanced diet with plenty of fruit and vegetables. Blood tests for lipids and cholesterol should  begin at age 27 and be repeated every 5 years. If your lipid or cholesterol levels are high, you are over 50, or you are at high risk for heart disease, you may need your cholesterol levels checked more frequently. Ongoing high lipid and cholesterol levels should be treated with medicines if diet and exercise are not working.  . If you smoke, find out from your health care provider how to quit. If you do not use tobacco, please do not start.  . If you choose to drink alcohol, please do not consume more than 2 drinks per day. One drink is considered to  be 12 ounces (355 mL) of beer, 5 ounces (148 mL) of wine, or 1.5 ounces (44 mL) of liquor.  . If you are 39-75 years old, ask your health care provider if you should take aspirin to prevent strokes.  . Use sunscreen. Apply sunscreen liberally and repeatedly throughout the day. You should seek shade when your shadow is shorter than you. Protect yourself by wearing long sleeves, pants, a wide-brimmed hat, and sunglasses year round, whenever you are outdoors.  . Once a month, do a whole body skin exam, using a mirror to look at the skin on your back. Tell your health care provider of new moles, moles that have irregular borders, moles that are larger than a pencil eraser, or moles that have changed in shape or color.

## 2018-09-05 NOTE — Progress Notes (Signed)
PCP notes:   Health maintenance:  Flu vaccine - administered  Abnormal screenings:   Hearing - failed  Hearing Screening   125Hz  250Hz  500Hz  1000Hz  2000Hz  3000Hz  4000Hz  6000Hz  8000Hz   Right ear:   40 40 40  0    Left ear:   0 40 40  0    Vision Screening Comments: Last vision exam in May 2018; future appt scheduled 09/14/2018   Patient concerns:   Patient wants PCP to assess his ears due to history of cerumen impaction.   Nurse concerns:  None  Next PCP appt:   09/17/18 @ 1030

## 2018-09-06 ENCOUNTER — Other Ambulatory Visit: Payer: Self-pay | Admitting: Family Medicine

## 2018-09-06 NOTE — Progress Notes (Signed)
   Subjective:    Patient ID: Bryan Barker, male    DOB: 1951/03/04, 67 y.o.   MRN: [id number]  HPI I reviewed health advisor's note, was available for consultation, and agree with documentation and plan.    Review of Systems     Objective:   Physical Exam         Assessment & Plan:

## 2018-09-10 ENCOUNTER — Encounter: Payer: Medicare Other | Admitting: Family Medicine

## 2018-09-16 ENCOUNTER — Encounter: Payer: Self-pay | Admitting: Family Medicine

## 2018-09-16 NOTE — Progress Notes (Signed)
BP 120/76 (BP Location: Left Arm, Patient Position: Sitting, Cuff Size: Normal)   Pulse 79   Temp 98.4 F (36.9 C) (Oral)   Ht 5' 7.5" (1.715 m)   Wt 164 lb 8 oz (74.6 kg)   SpO2 97%   BMI 25.38 kg/m    CC: CPE Subjective:    Patient ID: Bryan Barker, male    DOB: 08-07-1951, 67 y.o.   MRN: [id number]  HPI: [name] is a 67 y.o. male presenting on 09/17/2018 for Annual Exam (Pt 2.  Concerned about family hx of DM. )   Saw Katha Cabal last week for medicare wellness visit. Note reviewed. Requested ear evaluation for cerumen  Preventative: COLONOSCOPY Date: 10/2014 sig diverticulosis, rpt 5 yrs Carlean Purl)  Prostate screening - PSA has decreased again. Gets yearly checks. No nocturia. Strong stream.  Flu shot yearly prevnar 08/2014, pneumovax 2016 Tetanus 08/2016 zostavax - 06/2012  shingrix - discussed Advanced directives: Discussed. Would think wife or oldest daughter be HCPOA. Has packet at home - working on this.  Seat belt use discussed.  Sunscreen use discussed. Denies changing moles.  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 Interim medical history since our last visit reviewed. Allergies and medications reviewed and updated. Outpatient Medications Prior to Visit  Medication Sig Dispense Refill  . aspirin 81 MG tablet Take 81 mg by mouth daily.    . hydrocortisone (ANUSOL-HC) 2.5 % rectal cream Place 1 application rectally 2 (two) times daily. 30 g 0  . Omega-3 Fatty Acids (FISH OIL) 1000 MG CAPS Take by mouth daily.      . pravastatin (PRAVACHOL) 40 MG tablet TAKE 1 TABLET  BY MOUTH ONCE DAILY 90 tablet 0   No facility-administered medications prior to visit.      Per HPI unless specifically indicated in ROS section below Review of Systems  Constitutional: Negative for activity change, appetite change, chills, fatigue, fever and unexpected weight change.  HENT: Negative for hearing loss.   Eyes: Negative for visual disturbance.  Respiratory: Negative for cough, chest tightness, shortness of breath and wheezing.   Cardiovascular: Negative for chest pain, palpitations and leg swelling.  Gastrointestinal: Negative for abdominal distention, abdominal pain, blood in stool, constipation, diarrhea, nausea and vomiting.  Genitourinary: Negative for difficulty urinating and hematuria.  Musculoskeletal: Negative for arthralgias, myalgias and neck pain.  Skin: Negative for rash.  Neurological: Negative for dizziness, seizures, syncope and headaches.  Hematological: Negative for adenopathy. Does not bruise/bleed easily.  Psychiatric/Behavioral: Negative for dysphoric mood. The patient is not nervous/anxious.        Objective:    BP 120/76 (BP Location: Left Arm, Patient Position: Sitting, Cuff Size: Normal)   Pulse 79   Temp 98.4 F (36.9 C) (Oral)   Ht 5' 7.5" (1.715 m)   Wt 164 lb 8 oz (74.6 kg)   SpO2 97%   BMI 25.38 kg/m   Wt Readings from Last 3 Encounters:  09/17/18 164 lb 8 oz (74.6 kg)  09/05/18 161 lb 8 oz (73.3 kg)  09/07/17 162 lb (73.5 kg)    Physical Exam  Constitutional: He is oriented  to person, place, and time. He appears well-developed and well-nourished. No distress.  HENT:  Head: Normocephalic and atraumatic.  Right Ear: Hearing, tympanic membrane, external ear and ear canal normal.  Left Ear: Hearing, tympanic membrane, external ear and ear canal normal.  Nose: Nose normal.  Mouth/Throat: Uvula is midline, oropharynx is clear and moist and mucous membranes are normal. No oropharyngeal exudate, posterior oropharyngeal edema or  posterior oropharyngeal erythema.  Eyes: Pupils are equal, round, and reactive to light. Conjunctivae and EOM are normal. No scleral icterus.  Neck: Normal range of motion. Neck supple.  Cardiovascular: Normal rate, regular rhythm, normal heart sounds and intact distal pulses.  No murmur heard. Pulses:      Radial pulses are 2+ on the right side, and 2+ on the left side.  Pulmonary/Chest: Effort normal and breath sounds normal. No respiratory distress. He has no wheezes. He has no rales.  Abdominal: Soft. Bowel sounds are normal. He exhibits no distension and no mass. There is no tenderness. There is no rebound and no guarding.  Musculoskeletal: Normal range of motion. He exhibits no edema.  Lymphadenopathy:    He has no cervical adenopathy.  Neurological: He is alert and oriented to person, place, and time.  CN grossly intact, station and gait intact  Skin: Skin is warm and dry. No rash noted.  Psychiatric: He has a normal mood and affect. His behavior is normal. Judgment and thought content normal.  Nursing note and vitals reviewed.  Results for orders placed or performed in visit on 09/05/18  PSA, Medicare  Result Value Ref Range   PSA 1.68 0.10 - 4.00 ng/ml  Comprehensive metabolic panel  Result Value Ref Range   Sodium 140 135 - 145 mEq/L   Potassium 4.3 3.5 - 5.1 mEq/L   Chloride 103 96 - 112 mEq/L   CO2 31 19 - 32 mEq/L   Glucose, Bld 103 (H) 70 - 99 mg/dL   BUN 15 6 - 23 mg/dL   Creatinine, Ser 1.03 0.40 - 1.50 mg/dL   Total Bilirubin 0.6 0.2 - 1.2 mg/dL   Alkaline Phosphatase 70 39 - 117 U/L   AST 17 0 - 37 U/L   ALT 13 0 - 53 U/L   Total Protein 6.8 6.0 - 8.3 g/dL   Albumin 4.2 3.5 - 5.2 g/dL   Calcium 9.5 8.4 - 10.5 mg/dL   GFR 76.02 >60.00 mL/min  Lipid panel  Result Value Ref Range   Cholesterol 137 0 - 200 mg/dL   Triglycerides 113.0 0.0 - 149.0 mg/dL   HDL 36.30 (L) >39.00 mg/dL   VLDL 22.6 0.0 - 40.0 mg/dL   LDL Cholesterol 78 0 - 99 mg/dL   Total  CHOL/HDL Ratio 4    NonHDL 100.56       Assessment & Plan:   Problem List Items Addressed This Visit    Hyperglycemia    Anticipate mild prediabetes - discussed, will watch.       Hypercholesteremia    Chronic, stable on pravastatin and intermittent fish oil. He is having some shoulder aches and wonders if statin related. rec start coq10 50-100mg  daily. Discussed pros/cons of aspirin use.  The 10-year ASCVD risk score Mikey Bussing DC Jr., et al., 2013) is: 14.5%   Values used to calculate the score:     Age: 36 years     Sex: Male     Is Non-Hispanic African American: No     Diabetic: No     Tobacco smoker:  No     Systolic Blood Pressure: 220 mmHg     Is BP treated: No     HDL Cholesterol: 36.3 mg/dL     Total Cholesterol: 137 mg/dL       Relevant Medications   pravastatin (PRAVACHOL) 40 MG tablet   Healthcare maintenance - Primary    Preventative protocols reviewed and updated unless pt declined. Discussed healthy diet and lifestyle.       External hemorrhoids   Relevant Medications   pravastatin (PRAVACHOL) 40 MG tablet   Advanced care planning/counseling discussion    Advanced directives: Discussed. Would think wife or oldest daughter be HCPOA. Has packet at home - working on this.           Meds ordered this encounter  Medications  . pravastatin (PRAVACHOL) 40 MG tablet    Sig: Take 1 tablet (40 mg total) by mouth daily.    Dispense:  90 tablet    Refill:  3  . hydrocortisone (ANUSOL-HC) 2.5 % rectal cream    Sig: Place 1 application rectally 2 (two) times daily.    Dispense:  30 g    Refill:  0  . Coenzyme Q10 (COQ-10) 100 MG CAPS    Sig: Take 1 capsule by mouth daily.    Refill:  0   No orders of the defined types were placed in this encounter.   Follow up plan: Return in about 1 year (around 09/18/2019) for annual exam, prior fasting for blood work.  Ria Bush, MD

## 2018-09-17 ENCOUNTER — Encounter: Payer: Self-pay | Admitting: Family Medicine

## 2018-09-17 ENCOUNTER — Ambulatory Visit (INDEPENDENT_AMBULATORY_CARE_PROVIDER_SITE_OTHER): Payer: Medicare Other | Admitting: Family Medicine

## 2018-09-17 VITALS — BP 120/76 | HR 79 | Temp 98.4°F | Ht 67.5 in | Wt 164.5 lb

## 2018-09-17 DIAGNOSIS — Z0000 Encounter for general adult medical examination without abnormal findings: Secondary | ICD-10-CM | POA: Diagnosis not present

## 2018-09-17 DIAGNOSIS — Z7189 Other specified counseling: Secondary | ICD-10-CM

## 2018-09-17 DIAGNOSIS — E7800 Pure hypercholesterolemia, unspecified: Secondary | ICD-10-CM

## 2018-09-17 DIAGNOSIS — K644 Residual hemorrhoidal skin tags: Secondary | ICD-10-CM

## 2018-09-17 DIAGNOSIS — R7303 Prediabetes: Secondary | ICD-10-CM | POA: Insufficient documentation

## 2018-09-17 DIAGNOSIS — R739 Hyperglycemia, unspecified: Secondary | ICD-10-CM | POA: Insufficient documentation

## 2018-09-17 MED ORDER — HYDROCORTISONE 2.5 % RE CREA: 1.0000 "application " | TOPICAL_CREAM | Freq: Two times a day (BID) | RECTAL | 0 refills | Status: DC

## 2018-09-17 MED ORDER — COQ-10 100 MG PO CAPS: 1.0000 | ORAL_CAPSULE | Freq: Every day | ORAL | 0 refills | Status: DC

## 2018-09-17 MED ORDER — PRAVASTATIN SODIUM 40 MG PO TABS: 40.0000 mg | ORAL_TABLET | Freq: Every day | ORAL | 3 refills | Status: DC

## 2018-09-17 NOTE — Assessment & Plan Note (Addendum)
Chronic, stable on pravastatin and intermittent fish oil. He is having some shoulder aches and wonders if statin related. rec start coq10 50-100mg  daily. Discussed pros/cons of aspirin use.  The 10-year ASCVD risk score Bryan Barker., et al., 2013) is: 14.5%   Values used to calculate the score:     Age: 67 years     Sex: Male     Is Non-Hispanic African American: No     Diabetic: No     Tobacco smoker: No     Systolic Blood Pressure: 701 mmHg     Is BP treated: No     HDL Cholesterol: 36.3 mg/dL     Total Cholesterol: 137 mg/dL

## 2018-09-17 NOTE — Assessment & Plan Note (Signed)
Preventative protocols reviewed and updated unless pt declined. Discussed healthy diet and lifestyle.  

## 2018-09-17 NOTE — Assessment & Plan Note (Signed)
Advanced directives: Discussed. Would think wife or oldest daughter be HCPOA. Has packet at home - working on this.

## 2018-09-17 NOTE — Assessment & Plan Note (Addendum)
Anticipate mild prediabetes - discussed, will watch.

## 2018-09-17 NOTE — Patient Instructions (Addendum)
If interested, check with pharmacy about new 2 shot shingles series (shingrix).  Consider starting Co-enzyme Q10 supplement 50-100mg  daily (over the counter).  You are doing well today, blood work looking good! We will watch sugar levels. Return as needed or in 1 year for next wellness visit and physical.   Health Maintenance, Male A healthy lifestyle and preventive care is important for your health and wellness. Ask your health care provider about what schedule of regular examinations is right for you. What should I know about weight and diet? Eat a Healthy Diet  Eat plenty of vegetables, fruits, whole grains, low-fat dairy products, and lean protein.  Do not eat a lot of foods high in solid fats, added sugars, or salt.  Maintain a Healthy Weight Regular exercise can help you achieve or maintain a healthy weight. You should:  Do at least 150 minutes of exercise each week. The exercise should increase your heart rate and make you sweat (moderate-intensity exercise).  Do strength-training exercises at least twice a week.  Watch Your Levels of Cholesterol and Blood Lipids  Have your blood tested for lipids and cholesterol every 5 years starting at 69 years of age. If you are at high risk for heart disease, you should start having your blood tested when you are 69 years old. You may need to have your cholesterol levels checked more often if: ? Your lipid or cholesterol levels are high. ? You are older than 69 years of age. ? You are at high risk for heart disease.  What should I know about cancer screening? Many types of cancers can be detected early and may often be prevented. Lung Cancer  You should be screened every year for lung cancer if: ? You are a current smoker who has smoked for at least 30 years. ? You are a former smoker who has quit within the past 15 years.  Talk to your health care provider about your screening options, when you should start screening, and how often  you should be screened.  Colorectal Cancer  Routine colorectal cancer screening usually begins at 69 years of age and should be repeated every 5-10 years until you are 69 years old. You may need to be screened more often if early forms of precancerous polyps or small growths are found. Your health care provider may recommend screening at an earlier age if you have risk factors for colon cancer.  Your health care provider may recommend using home test kits to check for hidden blood in the stool.  A small camera at the end of a tube can be used to examine your colon (sigmoidoscopy or colonoscopy). This checks for the earliest forms of colorectal cancer.  Prostate and Testicular Cancer  Depending on your age and overall health, your health care provider may do certain tests to screen for prostate and testicular cancer.  Talk to your health care provider about any symptoms or concerns you have about testicular or prostate cancer.  Skin Cancer  Check your skin from head to toe regularly.  Tell your health care provider about any new moles or changes in moles, especially if: ? There is a change in a mole's size, shape, or color. ? You have a mole that is larger than a pencil eraser.  Always use sunscreen. Apply sunscreen liberally and repeat throughout the day.  Protect yourself by wearing long sleeves, pants, a wide-brimmed hat, and sunglasses when outside.  What should I know about heart disease, diabetes, and high  blood pressure?  If you are 14-29 years of age, have your blood pressure checked every 3-5 years. If you are 63 years of age or older, have your blood pressure checked every year. You should have your blood pressure measured twice-once when you are at a hospital or clinic, and once when you are not at a hospital or clinic. Record the average of the two measurements. To check your blood pressure when you are not at a hospital or clinic, you can use: ? An automated blood pressure  machine at a pharmacy. ? A home blood pressure monitor.  Talk to your health care provider about your target blood pressure.  If you are between 61-49 years old, ask your health care provider if you should take aspirin to prevent heart disease.  Have regular diabetes screenings by checking your fasting blood sugar level. ? If you are at a normal weight and have a low risk for diabetes, have this test once every three years after the age of 110. ? If you are overweight and have a high risk for diabetes, consider being tested at a younger age or more often.  A one-time screening for abdominal aortic aneurysm (AAA) by ultrasound is recommended for men aged 83-75 years who are current or former smokers. What should I know about preventing infection? Hepatitis B If you have a higher risk for hepatitis B, you should be screened for this virus. Talk with your health care provider to find out if you are at risk for hepatitis B infection. Hepatitis C Blood testing is recommended for:  Everyone born from [birth year] through 1967.  Anyone with known risk factors for hepatitis C.  Sexually Transmitted Diseases (STDs)  You should be screened each year for STDs including gonorrhea and chlamydia if: ? You are sexually active and are younger than 69 years of age. ? You are older than 69 years of age and your health care provider tells you that you are at risk for this type of infection. ? Your sexual activity has changed since you were last screened and you are at an increased risk for chlamydia or gonorrhea. Ask your health care provider if you are at risk.  Talk with your health care provider about whether you are at high risk of being infected with HIV. Your health care provider may recommend a prescription medicine to help prevent HIV infection.  What else can I do?  Schedule regular health, dental, and eye exams.  Stay current with your vaccines (immunizations).  Do not use any tobacco products,  such as cigarettes, chewing tobacco, and e-cigarettes. If you need help quitting, ask your health care provider.  Limit alcohol intake to no more than 2 drinks per day. One drink equals 12 ounces of beer, 5 ounces of wine, or 1 ounces of hard liquor.  Do not use street drugs.  Do not share needles.  Ask your health care provider for help if you need support or information about quitting drugs.  Tell your health care provider if you often feel depressed.  Tell your health care provider if you have ever been abused or do not feel safe at home. This information is not intended to replace advice given to you by your health care provider. Make sure you discuss any questions you have with your health care provider. Document Released: 04/21/2008 Document Revised: 06/22/2016 Document Reviewed: 07/28/2015 Elsevier Interactive Patient Education  [employer].

## 2019-06-19 ENCOUNTER — Other Ambulatory Visit: Payer: Self-pay

## 2019-06-19 ENCOUNTER — Encounter: Payer: Self-pay | Admitting: Family Medicine

## 2019-06-19 ENCOUNTER — Ambulatory Visit (INDEPENDENT_AMBULATORY_CARE_PROVIDER_SITE_OTHER): Payer: Medicare Other | Admitting: Family Medicine

## 2019-06-19 VITALS — BP 110/70 | HR 90 | Temp 98.8°F | Ht 67.5 in | Wt 161.1 lb

## 2019-06-19 DIAGNOSIS — R21 Rash and other nonspecific skin eruption: Secondary | ICD-10-CM

## 2019-06-19 MED ORDER — TRIAMCINOLONE ACETONIDE 0.1 % EX CREA: 1.0000 "application " | TOPICAL_CREAM | Freq: Two times a day (BID) | CUTANEOUS | 0 refills | Status: DC

## 2019-06-19 NOTE — Progress Notes (Signed)
Subjective:    Patient ID: Bryan Barker, male    DOB: [birth year], 68 y.o.   MRN: [id number]  HPI This is a 68 yo male who presents today for acute visit for rash. Has several red bumps on his ankles, legs, under arms, on trunk. No drainage.  First noticed 4 days ago. Itchy. Continues to have some new bumps. [place] Always wears long pants, socks and work boots outside. Wears shoes inside unless his feet are up then he keeps his socks on. No travel, no new soaps, lotions, shampoos or detergents.   Past Medical History:  Diagnosis Date  . Cataract 2019   bilateral eyes  . Dehydration 03/2011   ER for syncope attributed to dehydration  . Diverticulosis    by colonoscopy  . Hyperlipidemia    Past Surgical History:  Procedure Laterality Date  . 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 COLONOSCOPY  10/2014   sig diverticulosis, rpt 5 yrs Carlean Purl)  . 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 Diabetes Mother   . Hypertension Mother   . Heart disease Mother        Pacer  . Colon polyps Mother   . Coronary artery disease Father 49       MI  . Cancer Brother        esoph, mets to brain  . Diabetes Brother   . Alcohol abuse Sister   . Colon cancer Neg Hx   . Stroke Neg Hx    Social History   Tobacco Use  . Smoking status: Former Smoker    Packs/day: 1.00    Years: 3.00    Pack years: 3.00    Types: Cigarettes    Quit date: 06/16/1968    Years since quitting: 51.0  . Smokeless tobacco: Never Used  . Tobacco comment: only smoked as a teenager  Substance Use Topics  . Alcohol use: No    Alcohol/week: 0.0 standard drinks  . Drug use: No      Review of Systems Per HPI    Objective:   Physical Exam Vitals signs reviewed.  Constitutional:      General: He is not in acute distress.    Appearance: Normal  appearance. He is normal weight. He is not ill-appearing, toxic-appearing or diaphoretic.  Eyes:     Conjunctiva/sclera: Conjunctivae normal.  Cardiovascular:     Rate and Rhythm: Normal rate.  Pulmonary:     Effort: Pulmonary effort is normal.  Skin:    General: Skin is warm and dry.     Comments: Right ankle and lower leg with 8-10 scattered erythematous papules. No vesicles. No drainage, no streaking or surrounding erythema.   Neurological:     Mental Status: He is alert and oriented to person, place, and time.  Psychiatric:        Mood and Affect: Mood normal.        Behavior: Behavior normal.        Thought Content: Thought content normal.        Judgment: Judgment normal.      BP 110/70 (BP Location: Left Arm, Patient Position: Sitting, Cuff Size: Normal)   Pulse 90   Temp 98.8 F (37.1 C) (Temporal)   Ht 5' 7.5" (1.715 m)   Wt 161 lb 1.9  oz (73.1 kg)   SpO2 96%   BMI 24.86 kg/m  Wt Readings from Last 3 Encounters:  06/19/19 161 lb 1.9 oz (73.1 kg)  09/17/18 164 lb 8 oz (74.6 kg)  09/05/18 161 lb 8 oz (73.3 kg)        Assessment & Plan:  1. Rash - suspicious for insect bite bug unclear etiology - discussed with patient, will try topical steroid cream and if no improvement or if worsening, he is to call the office.  - triamcinolone cream (KENALOG) 0.1 %; Apply 1 application topically 2 (two) times daily. Use no more than 10 days in a row.  Dispense: 30 g; Refill: 0   Clarene Reamer, FNP-BC  [facility]  06/19/2019 2:25 PM

## 2019-06-19 NOTE — Patient Instructions (Signed)
Good to see you today  If worsening or no improvement in 4-5 days, please call the office

## 2019-08-01 ENCOUNTER — Ambulatory Visit (INDEPENDENT_AMBULATORY_CARE_PROVIDER_SITE_OTHER): Payer: Medicare Other

## 2019-08-01 DIAGNOSIS — Z23 Encounter for immunization: Secondary | ICD-10-CM

## 2019-09-08 ENCOUNTER — Other Ambulatory Visit: Payer: Self-pay | Admitting: Family Medicine

## 2019-09-08 DIAGNOSIS — R739 Hyperglycemia, unspecified: Secondary | ICD-10-CM

## 2019-09-08 DIAGNOSIS — Z125 Encounter for screening for malignant neoplasm of prostate: Secondary | ICD-10-CM

## 2019-09-08 DIAGNOSIS — E7800 Pure hypercholesterolemia, unspecified: Secondary | ICD-10-CM

## 2019-09-12 ENCOUNTER — Ambulatory Visit: Payer: Medicare Other

## 2019-09-12 ENCOUNTER — Ambulatory Visit (INDEPENDENT_AMBULATORY_CARE_PROVIDER_SITE_OTHER): Payer: Medicare Other

## 2019-09-12 ENCOUNTER — Other Ambulatory Visit (INDEPENDENT_AMBULATORY_CARE_PROVIDER_SITE_OTHER): Payer: Medicare Other

## 2019-09-12 DIAGNOSIS — E7800 Pure hypercholesterolemia, unspecified: Secondary | ICD-10-CM

## 2019-09-12 DIAGNOSIS — Z0000 Encounter for general adult medical examination without abnormal findings: Secondary | ICD-10-CM

## 2019-09-12 DIAGNOSIS — Z125 Encounter for screening for malignant neoplasm of prostate: Secondary | ICD-10-CM | POA: Diagnosis not present

## 2019-09-12 DIAGNOSIS — R739 Hyperglycemia, unspecified: Secondary | ICD-10-CM

## 2019-09-12 LAB — COMPREHENSIVE METABOLIC PANEL
ALT: 11 U/L (ref 0–53)
AST: 17 U/L (ref 0–37)
Albumin: 4.2 g/dL (ref 3.5–5.2)
Alkaline Phosphatase: 67 U/L (ref 39–117)
BUN: 15 mg/dL (ref 6–23)
CO2: 30 mEq/L (ref 19–32)
Calcium: 9.2 mg/dL (ref 8.4–10.5)
Chloride: 103 mEq/L (ref 96–112)
Creatinine, Ser: 1.06 mg/dL (ref 0.40–1.50)
GFR: 68.99 mL/min (ref >60.00)
Glucose, Bld: 100 mg/dL — ABNORMAL HIGH (ref 70–99)
Potassium: 4.2 mEq/L (ref 3.5–5.1)
Sodium: 139 mEq/L (ref 135–145)
Total Bilirubin: 0.8 mg/dL (ref 0.2–1.2)
Total Protein: 6.7 g/dL (ref 6.0–8.3)

## 2019-09-12 LAB — LIPID PANEL
Cholesterol: 146 mg/dL (ref 0–200)
HDL: 37.5 mg/dL — ABNORMAL LOW (ref >39.00)
LDL Cholesterol: 95 mg/dL (ref 0–99)
NonHDL: 108.54
Total CHOL/HDL Ratio: 4
Triglycerides: 70 mg/dL (ref 0.0–149.0)
VLDL: 14 mg/dL (ref 0.0–40.0)

## 2019-09-12 LAB — PSA, MEDICARE: PSA: 2.58 ng/ml (ref 0.10–4.00)

## 2019-09-12 LAB — HEMOGLOBIN A1C: Hgb A1c MFr Bld: 5.5 % (ref 4.6–6.5)

## 2019-09-12 NOTE — Progress Notes (Signed)
PCP notes:  Health Maintenance: Patient wants to receive the Shingrix vaccine if any is available.    Abnormal Screenings: none   Patient concerns: none   Nurse concerns: none   Next PCP appt.: 09/19/2019 @ 8:30 am

## 2019-09-12 NOTE — Patient Instructions (Signed)
Mr. Bryan Barker , Thank you for taking time to come for your Medicare Wellness Visit. I appreciate your ongoing commitment to your health goals. Please review the following plan we discussed and let me know if I can assist you in the future.   Screening recommendations/referrals: Colonoscopy: up to date, completed 10/16/2014 Recommended yearly ophthalmology/optometry visit for glaucoma screening and checkup Recommended yearly dental visit for hygiene and checkup  Vaccinations: Influenza vaccine: up to date, completed 08/01/2019 Pneumococcal vaccine: Completed series Tdap vaccine: up to date, completed 08/25/2016 Shingles vaccine: will check with insurance to verify coverage    Advanced directives: Please bring a copy of your POA (Power of Enterprise) and/or Living Will to your next appointment when you complete the paperwork.   Conditions/risks identified: hypercholesterolemia  Next appointment: 09/19/2019 @ 8:30 am   Preventive Care 70 Years and Older, Male Preventive care refers to lifestyle choices and visits with your health care provider that can promote health and wellness. What does preventive care include?  A yearly physical exam. This is also called an annual well check.  Dental exams once or twice a year.  Routine eye exams. Ask your health care provider how often you should have your eyes checked.  Personal lifestyle choices, including:  Daily care of your teeth and gums.  Regular physical activity.  Eating a healthy diet.  Avoiding tobacco and drug use.  Limiting alcohol use.  Practicing safe sex.  Taking low doses of aspirin every day.  Taking vitamin and mineral supplements as recommended by your health care provider. What happens during an annual well check? The services and screenings done by your health care provider during your annual well check will depend on your age, overall health, lifestyle risk factors, and family history of disease. Counseling  Your  health care provider may ask you questions about your:  Alcohol use.  Tobacco use.  Drug use.  Emotional well-being.  Home and relationship well-being.  Sexual activity.  Eating habits.  History of falls.  Memory and ability to understand (cognition).  Work and work Statistician. Screening  You may have the following tests or measurements:  Height, weight, and BMI.  Blood pressure.  Lipid and cholesterol levels. These may be checked every 5 years, or more frequently if you are over 70 years old.  Skin check.  Lung cancer screening. You may have this screening every year starting at age 70 if you have a 30-pack-year history of smoking and currently smoke or have quit within the past 15 years.  Fecal occult blood test (FOBT) of the stool. You may have this test every year starting at age 70.  Flexible sigmoidoscopy or colonoscopy. You may have a sigmoidoscopy every 5 years or a colonoscopy every 10 years starting at age 70.  Prostate cancer screening. Recommendations will vary depending on your family history and other risks.  Hepatitis C blood test.  Hepatitis B blood test.  Sexually transmitted disease (STD) testing.  Diabetes screening. This is done by checking your blood sugar (glucose) after you have not eaten for a while (fasting). You may have this done every 1-3 years.  Abdominal aortic aneurysm (AAA) screening. You may need this if you are a current or former smoker.  Osteoporosis. You may be screened starting at age 70 if you are at high risk. Talk with your health care provider about your test results, treatment options, and if necessary, the need for more tests. Vaccines  Your health care provider may recommend certain vaccines, such  as:  Influenza vaccine. This is recommended every year.  Tetanus, diphtheria, and acellular pertussis (Tdap, Td) vaccine. You may need a Td booster every 10 years.  Zoster vaccine. You may need this after age 70.   Pneumococcal 13-valent conjugate (PCV13) vaccine. One dose is recommended after age 70.  Pneumococcal polysaccharide (PPSV23) vaccine. One dose is recommended after age 70. Talk to your health care provider about which screenings and vaccines you need and how often you need them. This information is not intended to replace advice given to you by your health care provider. Make sure you discuss any questions you have with your health care provider. Document Released: 11/20/2015 Document Revised: 07/13/2016 Document Reviewed: 08/25/2015 Elsevier Interactive Patient Education  2017 [employer] Prevention in the Home Falls can cause injuries. They can happen to people of all ages. There are many things you can do to make your home safe and to help prevent falls. What can I do on the outside of my home?  Regularly fix the edges of walkways and driveways and fix any cracks.  Remove anything that might make you trip as you walk through a door, such as a raised step or threshold.  Trim any bushes or trees on the path to your home.  Use bright outdoor lighting.  Clear any walking paths of anything that might make someone trip, such as rocks or tools.  Regularly check to see if handrails are loose or broken. Make sure that both sides of any steps have handrails.  Any raised decks and porches should have guardrails on the edges.  Have any leaves, snow, or ice cleared regularly.  Use sand or salt on walking paths during winter.  Clean up any spills in your garage right away. This includes oil or grease spills. What can I do in the bathroom?  Use night lights.  Install grab bars by the toilet and in the tub and shower. Do not use towel bars as grab bars.  Use non-skid mats or decals in the tub or shower.  If you need to sit down in the shower, use a plastic, non-slip stool.  Keep the floor dry. Clean up any water that spills on the floor as soon as it happens.  Remove soap  buildup in the tub or shower regularly.  Attach bath mats securely with double-sided non-slip rug tape.  Do not have throw rugs and other things on the floor that can make you trip. What can I do in the bedroom?  Use night lights.  Make sure that you have a light by your bed that is easy to reach.  Do not use any sheets or blankets that are too big for your bed. They should not hang down onto the floor.  Have a firm chair that has side arms. You can use this for support while you get dressed.  Do not have throw rugs and other things on the floor that can make you trip. What can I do in the kitchen?  Clean up any spills right away.  Avoid walking on wet floors.  Keep items that you use a lot in easy-to-reach places.  If you need to reach something above you, use a strong step stool that has a grab bar.  Keep electrical cords out of the way.  Do not use floor polish or wax that makes floors slippery. If you must use wax, use non-skid floor wax.  Do not have throw rugs and other things on the  floor that can make you trip. What can I do with my stairs?  Do not leave any items on the stairs.  Make sure that there are handrails on both sides of the stairs and use them. Fix handrails that are broken or loose. Make sure that handrails are as long as the stairways.  Check any carpeting to make sure that it is firmly attached to the stairs. Fix any carpet that is loose or worn.  Avoid having throw rugs at the top or bottom of the stairs. If you do have throw rugs, attach them to the floor with carpet tape.  Make sure that you have a light switch at the top of the stairs and the bottom of the stairs. If you do not have them, ask someone to add them for you. What else can I do to help prevent falls?  Wear shoes that:  Do not have high heels.  Have rubber bottoms.  Are comfortable and fit you well.  Are closed at the toe. Do not wear sandals.  If you use a stepladder:  Make  sure that it is fully opened. Do not climb a closed stepladder.  Make sure that both sides of the stepladder are locked into place.  Ask someone to hold it for you, if possible.  Clearly mark and make sure that you can see:  Any grab bars or handrails.  First and last steps.  Where the edge of each step is.  Use tools that help you move around (mobility aids) if they are needed. These include:  Canes.  Walkers.  Scooters.  Crutches.  Turn on the lights when you go into a dark area. Replace any light bulbs as soon as they burn out.  Set up your furniture so you have a clear path. Avoid moving your furniture around.  If any of your floors are uneven, fix them.  If there are any pets around you, be aware of where they are.  Review your medicines with your doctor. Some medicines can make you feel dizzy. This can increase your chance of falling. Ask your doctor what other things that you can do to help prevent falls. This information is not intended to replace advice given to you by your health care provider. Make sure you discuss any questions you have with your health care provider. Document Released: 08/20/2009 Document Revised: 03/31/2016 Document Reviewed: 11/28/2014 Elsevier Interactive Patient Education  2017 [employer].

## 2019-09-12 NOTE — Progress Notes (Signed)
Subjective:   FORSTER PAMER is a 68 y.o. male who presents for Medicare Annual/Subsequent preventive examination.  Review of Systems: N/A   This visit is being conducted through telemedicine via telephone at the nurse health advisor's home address due to the COVID-19 pandemic. This patient has given me verbal consent via doximity to conduct this visit, patient states they are participating from their home address. Patient and myself are on the telephone call. There is no referral for this visit. Some vital signs may be absent or patient reported.    Patient identification: identified by name, DOB, and current address   Cardiac Risk Factors include: advanced age (>42men, >40 women);male gender;Other (see comment), Risk factor comments: hypercholesterolemia     Objective:    Vitals: There were no vitals taken for this visit.  There is no height or weight on file to calculate BMI.  Advanced Directives 09/12/2019 09/05/2018 09/04/2017 08/25/2016  Does Patient Have a Medical Advance Directive? No No No No  Would patient like information on creating a medical advance directive? Yes (MAU/Ambulatory/Procedural Areas - Information given) Yes (MAU/Ambulatory/Procedural Areas - Information given) No - Patient declined Yes - Educational materials given    Tobacco Social History   Tobacco Use  Smoking Status Former Smoker   Packs/day: 1.00   Years: 3.00   Pack years: 3.00   Types: Cigarettes   Quit date: 06/16/1968   Years since quitting: 51.2  Smokeless Tobacco Never Used  Tobacco Comment   only smoked as a teenager     Counseling given: Not Answered Comment: only smoked as a teenager   Clinical Intake:  Pre-visit preparation completed: Yes  Pain : No/denies pain     Nutritional Risks: None Diabetes: No  How often do you need to have someone help you when you read instructions, pamphlets, or other written materials from your doctor or pharmacy?: 1 - Never What  is the last grade level you completed in school?: 12th  Interpreter Needed?: No  Information entered by :: CJohnson, LPN  Past Medical History:  Diagnosis Date   Cataract 2019   bilateral eyes   Dehydration 03/2011   ER for syncope attributed to dehydration   Diverticulosis    by colonoscopy   Hyperlipidemia    Past Surgical History:  Procedure Laterality Date   COLONOSCOPY  06/30/11   2012: 4 polyps (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), diverticulosis   COLONOSCOPY  10/2014   sig diverticulosis, rpt 5 yrs Carlean Purl)   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 ([facility])    Family History  Problem Relation Age of Onset   Diabetes Mother    Hypertension Mother    Heart disease Mother        Pacer   Colon polyps Mother    Coronary artery disease Father 23       MI   Cancer Brother        esoph, mets to brain   Diabetes Brother    Alcohol abuse Sister    Colon cancer Neg Hx    Stroke Neg Hx    Social History   Socioeconomic History   Marital status: Married    Spouse name: Not on file   Number of children: 2   Years of education: Not on file   Highest education level: Not on file  Occupational History   Occupation: [employer]    Comment: retiring 2/11  Social Needs  Financial resource strain: Not hard at all   Food insecurity    Worry: Never true    Inability: Never true   Transportation needs    Medical: No    Non-medical: No  Tobacco Use   Smoking status: Former Smoker    Packs/day: 1.00    Years: 3.00    Pack years: 3.00    Types: Cigarettes    Quit date: 06/16/1968    Years since quitting: 51.2   Smokeless tobacco: Never Used   Tobacco comment: only smoked as a teenager  Substance and Sexual Activity   Alcohol use: No    Alcohol/week: 0.0 standard drinks   Drug use: No   Sexual activity: Yes  Lifestyle   Physical activity    Days per week: 0 days     Minutes per session: 0 min   Stress: Not at all  Relationships   Social connections    Talks on phone: Not on file    Gets together: Not on file    Attends religious service: Not on file    Active member of club or organization: Not on file    Attends meetings of clubs or organizations: Not on file    Relationship status: Not on file  Other Topics Concern   Not on file  Social History Narrative   Caffeine: 1 cup/day   Married and lives with wife, 1 dog, cows   2 daughters   Occupation: retired, was Presenter, 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   Coenzyme Q10 (COQ-10) 100 MG CAPS Take 1 capsule by mouth daily.   hydrocortisone (ANUSOL-HC) 2.5 % rectal cream Place 1 application rectally 2 (two) times daily.   pravastatin (PRAVACHOL) 40 MG tablet Take 1 tablet (40 mg total) by mouth daily.   triamcinolone cream (KENALOG) 0.1 % Apply 1 application topically 2 (two) times daily. Use no more than 10 days in a row.   No facility-administered encounter medications on file as of 09/12/2019.     Activities of Daily Living In your present state of health, do you have any difficulty performing the following activities: 09/12/2019  Hearing? N  Vision? N  Difficulty concentrating or making decisions? N  Walking or climbing stairs? N  Dressing or bathing? N  Doing errands, shopping? N  Preparing Food and eating ? N  Using the Toilet? N  In the past six months, have you accidently leaked urine? N  Do you have problems with loss of bowel control? N  Managing your Medications? N  Managing your Finances? N  Housekeeping or managing your Housekeeping? N  Some recent data might be hidden    Patient Care Team: Ria Bush, MD as PCP - General (Family Medicine) Eula Listen, DDS as  Referring Physician (Dentistry) Gatha Mayer, MD as Consulting Physician (Gastroenterology)   Assessment:   This is a routine wellness examination for [place].  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  Patient Stated     09/12/2019, I will maintain and continue medications as prescribed.        Fall Risk Fall Risk  09/12/2019 09/05/2018 09/04/2017 08/25/2016 09/08/2015  Falls in the past year? 0 No No No No  Number falls in past yr: 0 - - - -  Injury with Fall? 0 - - - -  Follow up Falls evaluation completed;Falls prevention discussed - - - -   Is the patient's home free of loose throw rugs in walkways, pet beds, electrical cords, etc?   yes      Grab bars in the bathroom? no      Handrails on the stairs?   yes      Adequate lighting?   yes  Timed Get Up and Go Performed: N/A  Depression Screen PHQ 2/9 Scores 09/12/2019 09/05/2018 09/04/2017 08/25/2016  PHQ - 2 Score 0 0 0 0  PHQ- 9 Score 0 0 0 -    Cognitive Function MMSE - Mini Mental State Exam 09/12/2019 09/05/2018 09/04/2017 08/25/2016  Orientation to time 5 5 5 5   Orientation to Place 5 5 5 5   Registration 3 3 3 3   Attention/ Calculation 5 0 0 0  Recall 3 3 3 3   Language- name 2 objects - 0 0 0  Language- repeat 1 1 1 1   Language- follow 3 step command - 3 3 3   Language- read & follow direction - 0 0 0  Write a sentence - 0 0 0  Copy design - 0 0 0  Total score - 20 20 20   Mini Cog  Mini-Cog screen was completed. Maximum score is 22. A value of 0 denotes this part of the MMSE was not completed or the patient failed this part of the Mini-Cog screening.         Immunization History  Administered Date(s) Administered   Fluad Quad(high Dose 65+) 08/01/2019   Influenza Split 08/23/2011, 08/21/2012   Influenza,inj,Quad PF,6+ Mos 08/20/2013, 08/21/2014, 09/08/2015, 08/25/2016, 09/04/2017, 09/05/2018   Pneumococcal Conjugate-13 08/21/2014   Pneumococcal Polysaccharide-23 09/08/2015   Td 06/17/2005, 08/25/2016   Zoster 06/28/2012    Qualifies for Shingles Vaccine? Yes  Screening Tests Health Maintenance  Topic Date Due   DTaP/Tdap/Td (1 - Tdap) 08/25/2026 (Originally 04/16/1970)   COLONOSCOPY  10/17/2019   TETANUS/TDAP  08/25/2026   INFLUENZA VACCINE  Completed   Hepatitis C Screening  Completed   PNA vac Low Risk Adult  Completed   Cancer Screenings: Lung: Low Dose CT Chest recommended if Age 65-80 years, 30 pack-year currently smoking OR have quit w/in 15years. Patient does not qualify. Colorectal: completed 10/16/2014  Additional Screenings:  Hepatitis C Screening: 08/25/2016      Plan:    Patient will maintain and continue medications as prescribed.   I have personally reviewed and noted the following in the patients chart:    Medical and social history  Use of alcohol, tobacco or illicit drugs   Current medications and supplements  Functional ability and status  Nutritional status  Physical activity  Advanced directives  List of other physicians  Hospitalizations, surgeries, and ER visits in previous 12 months  Vitals  Screenings to include cognitive, depression, and falls  Referrals and appointments  In addition, I have reviewed and discussed with patient certain preventive protocols, quality metrics, and best practice recommendations. A written personalized care plan for preventive services as well as general preventive health recommendations were provided to patient.     Andrez Grime, LPN  09/12/2019 ° ° °

## 2019-09-19 ENCOUNTER — Other Ambulatory Visit: Payer: Self-pay

## 2019-09-19 ENCOUNTER — Encounter: Payer: Self-pay | Admitting: Family Medicine

## 2019-09-19 ENCOUNTER — Ambulatory Visit (INDEPENDENT_AMBULATORY_CARE_PROVIDER_SITE_OTHER): Payer: Medicare Other | Admitting: Family Medicine

## 2019-09-19 VITALS — BP 120/68 | HR 78 | Temp 98.3°F | Ht 67.0 in | Wt 158.0 lb

## 2019-09-19 DIAGNOSIS — Z86010 Personal history of colonic polyps: Secondary | ICD-10-CM

## 2019-09-19 DIAGNOSIS — Z0000 Encounter for general adult medical examination without abnormal findings: Secondary | ICD-10-CM

## 2019-09-19 DIAGNOSIS — K644 Residual hemorrhoidal skin tags: Secondary | ICD-10-CM | POA: Diagnosis not present

## 2019-09-19 DIAGNOSIS — E7800 Pure hypercholesterolemia, unspecified: Secondary | ICD-10-CM

## 2019-09-19 DIAGNOSIS — Z7189 Other specified counseling: Secondary | ICD-10-CM

## 2019-09-19 DIAGNOSIS — M25512 Pain in left shoulder: Secondary | ICD-10-CM | POA: Insufficient documentation

## 2019-09-19 DIAGNOSIS — G8929 Other chronic pain: Secondary | ICD-10-CM

## 2019-09-19 MED ORDER — PRAVASTATIN SODIUM 40 MG PO TABS: 40.0000 mg | ORAL_TABLET | Freq: Every day | ORAL | 3 refills | Status: DC

## 2019-09-19 MED ORDER — ZOSTER VAC RECOMB ADJUVANTED 50 MCG/0.5ML IM SUSR: 0.5000 mL | Freq: Once | INTRAMUSCULAR | 1 refills | Status: AC

## 2019-09-19 NOTE — Assessment & Plan Note (Signed)
Refer back to GI. 

## 2019-09-19 NOTE — Assessment & Plan Note (Signed)
Advanced directives: Discussed. Would think wife or oldest daughter be HCPOA. Has packet at home - working on this.

## 2019-09-19 NOTE — Assessment & Plan Note (Signed)
Chronic, stable. Continue pravastatin. The 10-year ASCVD risk score Bryan Bussing DC Brooke Bonito., et al., 2013) is: 15.8%   Values used to calculate the score:     Age: 67 years     Sex: Male     Is Non-Hispanic African American: No     Diabetic: No     Tobacco smoker: No     Systolic Blood Pressure: 123456 mmHg     Is BP treated: No     HDL Cholesterol: 37.5 mg/dL     Total Cholesterol: 146 mg/dL

## 2019-09-19 NOTE — Progress Notes (Signed)
This visit was conducted in person.  BP 120/68 (BP Location: Left Arm, Patient Position: Sitting, Cuff Size: Normal)   Pulse 78   Temp 98.3 F (36.8 C) (Temporal)   Ht 5\' 7"  (1.702 m)   Wt 158 lb (71.7 kg)   SpO2 99%   BMI 24.75 kg/m    CC: CPE Subjective:    Patient ID: Bryan Barker, male    DOB: 1951-04-24, 68 y.o.   MRN: [id number]  HPI: Bryan Barker is a 68 y.o. male presenting on 09/19/2019 for Annual Exam (Prt 2.  Request rx for Shingrix vaccine. )   Saw health advisor last week for medicare wellness visit. Note reviewed.    No exam data present    Clinical Support from 09/12/2019 in [place] at [facility] Total Score  0      Fall Risk  09/12/2019 09/05/2018 09/04/2017 08/25/2016 09/08/2015  Falls in the past year? 0 No No No No  Number falls in past yr: 0 - - - -  Injury with Fall? 0 - - - -  Follow up Falls evaluation completed;Falls prevention discussed - - - -     Noticing L shoulder ache worse with cold weather, worse at night. No numbness or weakness of arm. Tried CoQ10. Treats with   Preventative: COLONOSCOPY Date: 10/2014 sig diverticulosis, rpt 5 yrs Carlean Purl)  Prostate screening - PSA has decreased again. Gets yearly checks. No nocturia. Strong stream.  Flu shot yearly prevnar 08/2014, pneumovax 2016 Tetanus 08/2016 zostavax- 06/2012 shingrix - Rx sent to pharmacy per pt request Advanced directives: Discussed. Would think wife or oldest daughter be HCPOA. Has packet at home - working on this.  Seat belt use discussed.  Sunscreen use discussed. Denies changing moles.  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 Interim medical history since our last visit reviewed. Allergies and medications reviewed and updated. Outpatient Medications Prior to Visit  Medication Sig Dispense Refill  . aspirin 81 MG tablet Take 81 mg by mouth daily.    . hydrocortisone (ANUSOL-HC) 2.5 % rectal cream Place 1 application rectally 2 (two) times daily. 30 g 0  . triamcinolone cream (KENALOG) 0.1 % Apply 1 application topically 2 (two) times daily. Use no more than 10 days in a row. 30 g 0  . Coenzyme Q10 (COQ-10) 100 MG CAPS Take 1 capsule by mouth daily.  0  . pravastatin (PRAVACHOL) 40 MG tablet Take 1 tablet (40 mg total) by mouth daily. 90 tablet 3   No facility-administered medications prior to visit.      Per HPI unless specifically indicated in ROS section below Review of Systems  Constitutional: Negative for activity change, appetite change, chills, fatigue, fever and unexpected weight change.  HENT: Negative for hearing loss.   Eyes: Negative for visual disturbance.  Respiratory: Negative for cough, chest tightness, shortness of breath and wheezing.   Cardiovascular: Negative for chest pain, palpitations and leg swelling.  Gastrointestinal: Negative for abdominal distention, abdominal pain, blood in stool, constipation, diarrhea, nausea and vomiting.  Genitourinary: Negative for difficulty urinating and hematuria.  Musculoskeletal: Negative for arthralgias, myalgias and neck pain.  Skin: Negative for rash.  Neurological: Negative for dizziness, seizures, syncope and headaches.  Hematological: Negative for adenopathy. Does not bruise/bleed easily.  Psychiatric/Behavioral: Negative for dysphoric mood. The patient is not nervous/anxious.    Objective:    BP 120/68 (BP Location: Left Arm, Patient Position: Sitting, Cuff Size: Normal)   Pulse 78   Temp 98.3 F (36.8 C)  (Temporal)   Ht 5\' 7"  (1.702 m)   Wt 158 lb (71.7 kg)   SpO2 99%   BMI 24.75 kg/m   Wt Readings from Last 3 Encounters:  09/19/19 158 lb (71.7 kg)  06/19/19 161 lb 1.9 oz (73.1 kg)  09/17/18 164 lb 8 oz (74.6 kg)    Physical Exam Vitals signs and nursing note reviewed.  Constitutional:      General: He is not in acute distress.    Appearance: Normal appearance. He is well-developed. He is not ill-appearing.  HENT:     Head: Normocephalic and atraumatic.     Right Ear: Hearing, tympanic membrane, ear canal and external ear normal.     Left Ear: Hearing, tympanic membrane, ear canal and external ear normal.     Nose: Nose normal.     Mouth/Throat:     Mouth: Mucous membranes are moist.     Pharynx: Oropharynx is clear. Uvula midline. No posterior oropharyngeal erythema.  Eyes:     General: No scleral icterus.    Extraocular Movements: Extraocular movements intact.     Conjunctiva/sclera: Conjunctivae normal.     Pupils: Pupils are equal, round, and reactive to light.  Neck:     Musculoskeletal: Normal range of motion and neck supple.     Vascular: No carotid bruit.  Cardiovascular:     Rate and Rhythm: Normal rate and regular rhythm.     Pulses: Normal pulses.          Radial pulses are 2+ on the right side and 2+ on the left side.     Heart sounds: Normal heart sounds. No murmur.  Pulmonary:     Effort: Pulmonary effort is normal. No respiratory distress.     Breath sounds: Normal breath sounds. No wheezing, rhonchi or rales.  Abdominal:     General: Abdomen is flat. Bowel sounds are normal. There is no distension.     Palpations: Abdomen is soft. There is no mass.     Tenderness: There is no abdominal tenderness. There is no guarding or rebound.     Hernia: No hernia is present.  Genitourinary:    Prostate: Enlarged (25gm). Not tender and no nodules present.     Rectum: External hemorrhoid (noninflamed) present. No mass, tenderness, anal fissure or internal hemorrhoid.  Normal anal tone.  Musculoskeletal: Normal range of motion.     Right lower leg: No edema.     Left lower leg: No edema.     Comments: No reproducible pain to palpation of L shoulder joint with preserved ROM  Lymphadenopathy:     Cervical: No cervical adenopathy.  Skin:    General: Skin is warm and dry.     Findings: No rash.  Neurological:     General: No focal deficit present.     Mental Status: He is alert and oriented to person, place, and time.     Comments: CN grossly intact, station and gait intact  Psychiatric:        Mood and  Affect: Mood normal.        Behavior: Behavior normal.        Thought Content: Thought content normal.        Judgment: Judgment normal.       Results for orders placed or performed in visit on 09/12/19  Hemoglobin A1c  Result Value Ref Range   Hgb A1c MFr Bld 5.5 4.6 - 6.5 %  PSA, Medicare  Result Value Ref Range   PSA 2.58 0.10 - 4.00 ng/ml  Comprehensive metabolic panel  Result Value Ref Range   Sodium 139 135 - 145 mEq/L   Potassium 4.2 3.5 - 5.1 mEq/L   Chloride 103 96 - 112 mEq/L   CO2 30 19 - 32 mEq/L   Glucose, Bld 100 (H) 70 - 99 mg/dL   BUN 15 6 - 23 mg/dL   Creatinine, Ser 1.06 0.40 - 1.50 mg/dL   Total Bilirubin 0.8 0.2 - 1.2 mg/dL   Alkaline Phosphatase 67 39 - 117 U/L   AST 17 0 - 37 U/L   ALT 11 0 - 53 U/L   Total Protein 6.7 6.0 - 8.3 g/dL   Albumin 4.2 3.5 - 5.2 g/dL   GFR 68.99 >60.00 mL/min   Calcium 9.2 8.4 - 10.5 mg/dL  Lipid panel  Result Value Ref Range   Cholesterol 146 0 - 200 mg/dL   Triglycerides 70.0 0.0 - 149.0 mg/dL   HDL 37.50 (L) >39.00 mg/dL   VLDL 14.0 0.0 - 40.0 mg/dL   LDL Cholesterol 95 0 - 99 mg/dL   Total CHOL/HDL Ratio 4    NonHDL 108.54    Assessment & Plan:   Problem List Items Addressed This Visit    Left shoulder pain    Anticipate RTC irritation. Discussed tylenol/topical diclofenac.       Hypercholesteremia    Chronic, stable. Continue pravastatin. The 10-year ASCVD risk score  Mikey Bussing DC Brooke Bonito., et al., 2013) is: 15.8%   Values used to calculate the score:     Age: 54 years     Sex: Male     Is Non-Hispanic African American: No     Diabetic: No     Tobacco smoker: No     Systolic Blood Pressure: 123456 mmHg     Is BP treated: No     HDL Cholesterol: 37.5 mg/dL     Total Cholesterol: 146 mg/dL       Relevant Medications   pravastatin (PRAVACHOL) 40 MG tablet   History of colonic polyps    Refer back to GI.       Relevant Orders   Ambulatory referral to Gastroenterology   Healthcare maintenance - Primary    Preventative protocols reviewed and updated unless pt declined. Discussed healthy diet and lifestyle.       External hemorrhoids    Bowel regimen is nightly dulcolax stool softener and metamucil       Relevant Medications   pravastatin (PRAVACHOL) 40 MG tablet   Advanced care planning/counseling discussion    Advanced directives: Discussed. Would think wife or oldest daughter be HCPOA. Has packet at home - working on this.           Meds ordered this encounter  Medications  . pravastatin (PRAVACHOL) 40 MG tablet    Sig: Take 1 tablet (40 mg total) by mouth daily.    Dispense:  90 tablet    Refill:  3  . Zoster Vaccine Adjuvanted [facility]) injection    Sig: Inject 0.5  mLs into the muscle once for 1 dose. Repeat in 2-6 months    Dispense:  0.5 mL    Refill:  1   Orders Placed This Encounter  Procedures  . Ambulatory referral to Gastroenterology    Referral Priority:   Routine    Referral Type:   Consultation    Referral Reason:   Specialty Services Required    Number of Visits Requested:   1    Patient instructions: Shingrix vaccine sent to pharmacy. Continue working on setting up living will.  For shoulder pain, try tylenol 500mg  at bedtime, could also try topical diclofenac (voltaren) to shoulder or muscle rub or salon pas patch.  We changed MyChart password to "password" - change when you get home.  You are doing well today.  Return as needed or in 1 year for next physical .  Follow up plan: Return in about 1 year (around 09/18/2020) for annual exam, prior fasting for blood work, medicare wellness visit.  Ria Bush, MD

## 2019-09-19 NOTE — Assessment & Plan Note (Signed)
Anticipate RTC irritation. Discussed tylenol/topical diclofenac.

## 2019-09-19 NOTE — Patient Instructions (Addendum)
Shingrix vaccine sent to pharmacy. Continue working on setting up living will.  For shoulder pain, try tylenol 500mg  at bedtime, could also try topical diclofenac (voltaren) to shoulder or muscle rub or salon pas patch.  We changed MyChart password to "password" - change when you get home.  You are doing well today. Return as needed or in 1 year for next physical .  Health Maintenance After Age 70 After age 38, you are at a higher risk for certain long-term diseases and infections as well as injuries from falls. Falls are a major cause of broken bones and head injuries in people who are older than age 61. Getting regular preventive care can help to keep you healthy and well. Preventive care includes getting regular testing and making lifestyle changes as recommended by your health care provider. Talk with your health care provider about:  Which screenings and tests you should have. A screening is a test that checks for a disease when you have no symptoms.  A diet and exercise plan that is right for you. What should I know about screenings and tests to prevent falls? Screening and testing are the best ways to find a health problem early. Early diagnosis and treatment give you the best chance of managing medical conditions that are common after age 25. Certain conditions and lifestyle choices may make you more likely to have a fall. Your health care provider may recommend:  Regular vision checks. Poor vision and conditions such as cataracts can make you more likely to have a fall. If you wear glasses, make sure to get your prescription updated if your vision changes.  Medicine review. Work with your health care provider to regularly review all of the medicines you are taking, including over-the-counter medicines. Ask your health care provider about any side effects that may make you more likely to have a fall. Tell your health care provider if any medicines that you take make you feel dizzy or  sleepy.  Osteoporosis screening. Osteoporosis is a condition that causes the bones to get weaker. This can make the bones weak and cause them to break more easily.  Blood pressure screening. Blood pressure changes and medicines to control blood pressure can make you feel dizzy.  Strength and balance checks. Your health care provider may recommend certain tests to check your strength and balance while standing, walking, or changing positions.  Foot health exam. Foot pain and numbness, as well as not wearing proper footwear, can make you more likely to have a fall.  Depression screening. You may be more likely to have a fall if you have a fear of falling, feel emotionally low, or feel unable to do activities that you used to do.  Alcohol use screening. Using too much alcohol can affect your balance and may make you more likely to have a fall. What actions can I take to lower my risk of falls? General instructions  Talk with your health care provider about your risks for falling. Tell your health care provider if: ? You fall. Be sure to tell your health care provider about all falls, even ones that seem minor. ? You feel dizzy, sleepy, or off-balance.  Take over-the-counter and prescription medicines only as told by your health care provider. These include any supplements.  Eat a healthy diet and maintain a healthy weight. A healthy diet includes low-fat dairy products, low-fat (lean) meats, and fiber from whole grains, beans, and lots of fruits and vegetables. Home safety  Remove any  tripping hazards, such as rugs, cords, and clutter.  Install safety equipment such as grab bars in bathrooms and safety rails on stairs.  Keep rooms and walkways well-lit. Activity   Follow a regular exercise program to stay fit. This will help you maintain your balance. Ask your health care provider what types of exercise are appropriate for you.  If you need a cane or walker, use it as recommended by  your health care provider.  Wear supportive shoes that have nonskid soles. Lifestyle  Do not drink alcohol if your health care provider tells you not to drink.  If you drink alcohol, limit how much you have: ? 0-1 drink a day for women. ? 0-2 drinks a day for men.  Be aware of how much alcohol is in your drink. In the [place] one drink equals one typical bottle of beer (12 oz), one-half glass of wine (5 oz), or one shot of hard liquor (1 oz).  Do not use any products that contain nicotine or tobacco, such as cigarettes and e-cigarettes. If you need help quitting, ask your health care provider. Summary  Having a healthy lifestyle and getting preventive care can help to protect your health and wellness after age 33.  Screening and testing are the best way to find a health problem early and help you avoid having a fall. Early diagnosis and treatment give you the best chance for managing medical conditions that are more common for people who are older than age 87.  Falls are a major cause of broken bones and head injuries in people who are older than age 71. Take precautions to prevent a fall at home.  Work with your health care provider to learn what changes you can make to improve your health and wellness and to prevent falls. This information is not intended to replace advice given to you by your health care provider. Make sure you discuss any questions you have with your health care provider. Document Released: 09/06/2017 Document Revised: 02/14/2019 Document Reviewed: 09/06/2017 Elsevier Patient Education  2020 [employer].

## 2019-09-19 NOTE — Assessment & Plan Note (Signed)
Preventative protocols reviewed and updated unless pt declined. Discussed healthy diet and lifestyle.  

## 2019-09-19 NOTE — Assessment & Plan Note (Signed)
Bowel regimen is nightly dulcolax stool softener and metamucil

## 2019-09-20 ENCOUNTER — Encounter: Payer: Self-pay | Admitting: Internal Medicine

## 2019-10-08 HISTORY — PX: COLONOSCOPY: SHX174

## 2019-10-16 ENCOUNTER — Other Ambulatory Visit: Payer: Self-pay

## 2019-10-16 ENCOUNTER — Encounter: Payer: Self-pay | Admitting: Internal Medicine

## 2019-10-16 ENCOUNTER — Ambulatory Visit (AMBULATORY_SURGERY_CENTER): Payer: Medicare Other | Admitting: *Deleted

## 2019-10-16 VITALS — Temp 97.3°F | Ht 67.0 in | Wt 163.6 lb

## 2019-10-16 DIAGNOSIS — Z86010 Personal history of colonic polyps: Secondary | ICD-10-CM

## 2019-10-16 DIAGNOSIS — Z1159 Encounter for screening for other viral diseases: Secondary | ICD-10-CM

## 2019-10-16 NOTE — Progress Notes (Signed)
Patient is here in-person for PV. Patient denies any allergies to eggs or soy. Patient denies any problems with anesthesia/sedation. Patient denies any oxygen use at home. Patient denies taking any diet/weight loss medications or blood thinners. Patient is not being treated for MRSA or C-diff. EMMI education assisgned to the patient for the procedure, this was explained and instructions given to patient. COVID-19 screening test is on 12/15 @ [place], the pt is aware. Pt is aware that care partner will wait in the car during procedure; if they feel like they will be too hot or cold to wait in the car; they may wait in the 4 th floor lobby. Patient is aware to bring only one care partner. We want them to wear a mask (we do not have any that we can provide them), practice social distancing, and we will check their temperatures when they get here.  I did remind the patient that their care partner needs to stay in the parking lot the entire time and have a cell phone available, we will call them when the pt is ready for discharge. Patient will wear mask into building.

## 2019-10-22 ENCOUNTER — Other Ambulatory Visit
Admission: RE | Admit: 2019-10-22 | Discharge: 2019-10-22 | Disposition: A | Discharge status: Home / Self Care | Payer: Medicare Other | Source: Ambulatory Visit | Attending: Internal Medicine | Admitting: Internal Medicine

## 2019-10-22 DIAGNOSIS — Z20828 Contact with and (suspected) exposure to other viral communicable diseases: Secondary | ICD-10-CM | POA: Diagnosis not present

## 2019-10-22 DIAGNOSIS — Z01812 Encounter for preprocedural laboratory examination: Secondary | ICD-10-CM | POA: Diagnosis present

## 2019-10-22 LAB — SARS CORONAVIRUS 2 (TAT 6-24 HRS): SARS Coronavirus 2: NEGATIVE

## 2019-10-25 ENCOUNTER — Ambulatory Visit (AMBULATORY_SURGERY_CENTER): Payer: Medicare Other | Admitting: Internal Medicine

## 2019-10-25 ENCOUNTER — Encounter: Payer: Self-pay | Admitting: Internal Medicine

## 2019-10-25 ENCOUNTER — Other Ambulatory Visit: Payer: Self-pay

## 2019-10-25 VITALS — BP 108/63 | HR 63 | Temp 97.9°F | Resp 12 | Ht 67.0 in | Wt 163.0 lb

## 2019-10-25 DIAGNOSIS — D120 Benign neoplasm of cecum: Secondary | ICD-10-CM

## 2019-10-25 DIAGNOSIS — D123 Benign neoplasm of transverse colon: Secondary | ICD-10-CM

## 2019-10-25 DIAGNOSIS — Z86010 Personal history of colonic polyps: Secondary | ICD-10-CM | POA: Diagnosis not present

## 2019-10-25 MED ORDER — SODIUM CHLORIDE 0.9 % IV SOLN: 500.0000 mL | Freq: Once | INTRAVENOUS | Status: DC

## 2019-10-25 NOTE — Op Note (Signed)
[facility] Patient Name: Bryan Barker Procedure Date: 10/25/2019 8:08 AM MRN: [id number] Endoscopist: Gatha Mayer , MD Age: 68 Referring MD:  Date of Birth: [birth year] Gender: Male Account #: [id number] Procedure:                Colonoscopy Indications:              Surveillance: Personal history of adenomatous                            polyps on last colonoscopy 5 years ago Medicines:                Propofol per Anesthesia, Monitored Anesthesia Care Procedure:                Pre-Anesthesia Assessment:                           - Prior to the procedure, a History and Physical                            was performed, and patient medications and                            allergies were reviewed. The patient's tolerance of                            previous anesthesia was also reviewed. The risks                            and benefits of the procedure and the sedation                            options and risks were discussed with the patient.                            All questions were answered, and informed consent                            was obtained. Prior Anticoagulants: The patient has                            taken no previous anticoagulant or antiplatelet                            agents. ASA Grade Assessment: II - A patient with                            mild systemic disease. After reviewing the risks                            and benefits, the patient was deemed in                            satisfactory condition to undergo the procedure.  After obtaining informed consent, the colonoscope                            was passed under direct vision. Throughout the                            procedure, the patient's blood pressure, pulse, and                            oxygen saturations were monitored continuously. The                            Colonoscope was introduced through the anus and   advanced to the the cecum, identified by                            appendiceal orifice and ileocecal valve. The                            colonoscopy was performed without difficulty. The                            patient tolerated the procedure well. The quality                            of the bowel preparation was excellent. The                            ileocecal valve, appendiceal orifice, and rectum                            were photographed. The bowel preparation used was                            Miralax via split dose instruction. Scope In: 8:15:40 AM Scope Out: 8:29:35 AM Scope Withdrawal Time: 0 hours 11 minutes 12 seconds  Total Procedure Duration: 0 hours 13 minutes 55 seconds  Findings:                 Two sessile polyps were found in the transverse                            colon and cecum. The polyps were diminutive in                            size. These polyps were removed with a cold snare.                            Resection and retrieval were complete. Verification                            of patient identification for the specimen was  done. Estimated blood loss was minimal.                           Multiple diverticula were found in the left colon.                           Skin tags were found on perianal exam.                           The digital rectal exam was normal. Pertinent                            negatives include normal prostate (size, shape, and                            consistency).                           The exam was otherwise without abnormality on                            direct and retroflexion views. Complications:            No immediate complications. Estimated Blood Loss:     Estimated blood loss was minimal. Impression:               - Two diminutive polyps in the transverse colon and                            in the cecum, removed with a cold snare. Resected                            and  retrieved.                           - Diverticulosis in the left colon.                           - Perianal skin tags found on perianal exam.                           - The examination was otherwise normal on direct                            and retroflexion views.                           - Personal history of colonic polyps. 2012 2                            polyps, TVA and TA max 15 mm, 2015 no polyps Recommendation:           - Patient has a contact number available for                            emergencies. The signs and  symptoms of potential                            delayed complications were discussed with the                            patient. Return to normal activities tomorrow.                            Written discharge instructions were provided to the                            patient.                           - Resume previous diet.                           - Continue present medications.                           - Repeat colonoscopy is recommended for                            surveillance. The colonoscopy date will be                            determined after pathology results from today's                            exam become available for review. Gatha Mayer, MD 10/25/2019 8:41:28 AM This report has been signed electronically.

## 2019-10-25 NOTE — Patient Instructions (Signed)
HANDOUTS PROVIDED ON: POLYPS & DIVERTICULOSIS  THE 2 POLYPS REMOVED TODAY HAVE BEEN SENT FOR PATHOLOGY.  THE RESULTS CAN TAKE 2-3 WEEKS TO RECEIVE.  BASED ON THE RESULTS IS WHEN YOUR NEXT COLONOSCOPY WILL BE RECOMMENDED.  YOU MAY RESUME YOUR PREVIOUS DIET AND MEDICATION SCHEDULE.  [place] YOU FOR ALLOWING [place] TO CARE FOR YOU TODAY!!!  YOU HAD AN ENDOSCOPIC PROCEDURE TODAY AT [facility]:   Refer to the procedure report that was given to you for any specific questions about what was found during the examination.  If the procedure report does not answer your questions, please call your gastroenterologist to clarify.  If you requested that your care partner not be given the details of your procedure findings, then the procedure report has been included in a sealed envelope for you to review at your convenience later.  YOU SHOULD EXPECT: Some feelings of bloating in the abdomen. Passage of more gas than usual.  Walking can help get rid of the air that was put into your GI tract during the procedure and reduce the bloating. If you had a lower endoscopy (such as a colonoscopy or flexible sigmoidoscopy) you may notice spotting of blood in your stool or on the toilet paper. If you underwent a bowel prep for your procedure, you may not have a normal bowel movement for a few days.  Please Note:  You might notice some irritation and congestion in your nose or some drainage.  This is from the oxygen used during your procedure.  There is no need for concern and it should clear up in a day or so.  SYMPTOMS TO REPORT IMMEDIATELY:   Following lower endoscopy (colonoscopy or flexible sigmoidoscopy):  Excessive amounts of blood in the stool  Significant tenderness or worsening of abdominal pains  Swelling of the abdomen that is new, acute  Fever of 100F or higher   For urgent or emergent issues, a gastroenterologist can be reached at any hour by calling [phone].   DIET:  We do  recommend a small meal at first, but then you may proceed to your regular diet.  Drink plenty of fluids but you should avoid alcoholic beverages for 24 hours.  ACTIVITY:  You should plan to take it easy for the rest of today and you should NOT DRIVE or use heavy machinery until tomorrow (because of the sedation medicines used during the test).    FOLLOW UP: Our staff will call the number listed on your records 48-72 hours following your procedure to check on you and address any questions or concerns that you may have regarding the information given to you following your procedure. If we do not reach you, we will leave a message.  We will attempt to reach you two times.  During this call, we will ask if you have developed any symptoms of COVID 19. If you develop any symptoms (ie: fever, flu-like symptoms, shortness of breath, cough etc.) before then, please call [phone].  If you test positive for Covid 19 in the 2 weeks post procedure, please call and report this information to [place].    If any biopsies were taken you will be contacted by phone or by letter within the next 1-3 weeks.  Please call us at [phone] if you have not heard about the biopsies in 3 weeks.    SIGNATURES/CONFIDENTIALITY: You and/or your care partner have signed paperwork which will be entered into your electronic medical record.  These signatures attest to  the fact that that the information above on your After Visit Summary has been reviewed and is understood.  Full responsibility of the confidentiality of this discharge information lies with you and/or your care-partner.

## 2019-10-25 NOTE — Progress Notes (Signed)
Called to room to assist during endoscopic procedure.  Patient ID and intended procedure confirmed with present staff. Received instructions for my participation in the procedure from the performing physician.  

## 2019-10-25 NOTE — Progress Notes (Signed)
Pt's states no medical or surgical changes since previsit or office visit. VS done by CW. Temp done by JB.

## 2019-10-25 NOTE — Progress Notes (Signed)
Pt tolerated well. VSS. Awake and to recovery. 

## 2019-10-29 ENCOUNTER — Telehealth: Payer: Self-pay | Admitting: *Deleted

## 2019-10-29 NOTE — Telephone Encounter (Signed)
No answer for post procedure follow up call. Left message for patient to call back with questions or concerns. SM

## 2019-10-29 NOTE — Telephone Encounter (Signed)
Attempted phone call. No answer. Left message.  

## 2019-11-07 ENCOUNTER — Encounter: Payer: Self-pay | Admitting: Internal Medicine

## 2019-12-21 ENCOUNTER — Ambulatory Visit: Payer: Medicare Other | Attending: Internal Medicine

## 2019-12-21 DIAGNOSIS — Z23 Encounter for immunization: Secondary | ICD-10-CM | POA: Insufficient documentation

## 2019-12-21 NOTE — Progress Notes (Signed)
   Covid-19 Vaccination Clinic  Name:  Bryan Barker    MRN: [id number] DOB: 04/05/1951  12/21/2019  Mr. Bryan Barker was observed post Covid-19 immunization for 15 minutes without incidence. He was provided with Vaccine Information Sheet and instruction to access the V-Safe system.   Mr. Bryan Barker was instructed to call 911 with any severe reactions post vaccine: [name] Kitchen Difficulty breathing  . Swelling of your face and throat  . A fast heartbeat  . A bad rash all over your body  . Dizziness and weakness    Immunizations Administered    Name Date Dose VIS Date Route   Pfizer COVID-19 Vaccine 12/21/2019  2:46 PM 0.3 mL 10/18/2019 Intramuscular   Manufacturer: [place]   Lot: [id number]   [place]: [id number]

## 2020-01-13 ENCOUNTER — Ambulatory Visit: Payer: Medicare Other | Attending: Internal Medicine

## 2020-01-13 DIAGNOSIS — Z23 Encounter for immunization: Secondary | ICD-10-CM

## 2020-01-13 NOTE — Progress Notes (Signed)
   Covid-19 Vaccination Clinic  Name:  Bryan Barker    MRN: [id number] DOB: 12/08/1950  01/13/2020  Mr. Bryan Barker was observed post Covid-19 immunization for 15 minutes without incident. He was provided with Vaccine Information Sheet and instruction to access the V-Safe system.   Mr. Bryan Barker was instructed to call 911 with any severe reactions post vaccine: [name] Kitchen Difficulty breathing  . Swelling of face and throat  . A fast heartbeat  . A bad rash all over body  . Dizziness and weakness   Immunizations Administered    Name Date Dose VIS Date Route   Pfizer COVID-19 Vaccine 01/13/2020  3:56 PM 0.3 mL 10/18/2019 Intramuscular   Manufacturer: [place]   Lot: [id number]   [place]: [id number]

## 2020-04-28 ENCOUNTER — Encounter: Payer: Self-pay | Admitting: Family Medicine

## 2020-06-12 ENCOUNTER — Other Ambulatory Visit: Payer: Self-pay

## 2020-06-12 ENCOUNTER — Ambulatory Visit (INDEPENDENT_AMBULATORY_CARE_PROVIDER_SITE_OTHER): Payer: Medicare Other | Admitting: Podiatry

## 2020-06-12 DIAGNOSIS — M79675 Pain in left toe(s): Secondary | ICD-10-CM | POA: Diagnosis not present

## 2020-06-12 DIAGNOSIS — M79674 Pain in right toe(s): Secondary | ICD-10-CM | POA: Diagnosis not present

## 2020-06-12 DIAGNOSIS — B351 Tinea unguium: Secondary | ICD-10-CM | POA: Diagnosis not present

## 2020-06-12 DIAGNOSIS — Z79899 Other long term (current) drug therapy: Secondary | ICD-10-CM | POA: Diagnosis not present

## 2020-06-12 NOTE — Progress Notes (Signed)
   SUBJECTIVE Patient presents to office today complaining of elongated, thickened nails that cause pain while ambulating in shoes.  He is unable to trim his own nails. Patient is here for further evaluation and treatment.  Past Medical History:  Diagnosis Date  . Adenomatous colon polyp 2012  . Cataract 2019   bilateral eyes  . Dehydration 03/2011   ER for syncope attributed to dehydration  . Diverticulosis    by colonoscopy  . Hemorrhoids   . Hyperlipidemia     OBJECTIVE General Patient is awake, alert, and oriented x 3 and in no acute distress. Derm Skin is dry and supple bilateral. Negative open lesions or macerations. Remaining integument unremarkable. Nails are tender, long, thickened and dystrophic with subungual debris, consistent with onychomycosis, 1-5 bilateral. No signs of infection noted. Vasc  DP and PT pedal pulses palpable bilaterally. Temperature gradient within normal limits.  Neuro Epicritic and protective threshold sensation grossly intact bilaterally.  Musculoskeletal Exam No symptomatic pedal deformities noted bilateral. Muscular strength within normal limits.  ASSESSMENT 1. Onychodystrophic nails 1-5 bilateral with hyperkeratosis of nails.  2. Onychomycosis of nail due to dermatophyte bilateral 3. Pain in foot bilateral  PLAN OF CARE 1. Patient evaluated today.  2. Instructed to maintain good pedal hygiene and foot care.  3. Mechanical debridement of nails 1-5 bilaterally performed using a nail nipper. Filed with dremel without incident.  4.  Order for hepatic function panel.  If labs are within normal limits we will prescribe Lamisil 250 mg #90  5.  Return to clinic in 3 mos.    Edrick Kins, DPM [facility]  Dr. Edrick Kins, [address]                                        [place], [place] [address]                Office [phone]  Fax [phone]

## 2020-06-13 LAB — HEPATIC FUNCTION PANEL
ALT: 17 IU/L (ref 0–44)
AST: 21 IU/L (ref 0–40)
Albumin: 4.4 g/dL (ref 3.7–4.7)
Alkaline Phosphatase: 92 IU/L (ref 48–121)
Bilirubin Total: 0.5 mg/dL (ref 0.0–1.2)
Bilirubin, Direct: 0.14 mg/dL (ref 0.00–0.40)
Total Protein: 7 g/dL (ref 6.0–8.5)

## 2020-06-19 ENCOUNTER — Other Ambulatory Visit: Payer: Self-pay | Admitting: Podiatry

## 2020-06-19 MED ORDER — TERBINAFINE HCL 250 MG PO TABS: 250.0000 mg | ORAL_TABLET | Freq: Every day | ORAL | 0 refills | Status: DC

## 2020-06-19 NOTE — Progress Notes (Signed)
LFT WNL

## 2020-07-23 ENCOUNTER — Other Ambulatory Visit: Payer: Self-pay

## 2020-07-23 ENCOUNTER — Ambulatory Visit (INDEPENDENT_AMBULATORY_CARE_PROVIDER_SITE_OTHER): Payer: Medicare Other

## 2020-07-23 DIAGNOSIS — Z23 Encounter for immunization: Secondary | ICD-10-CM | POA: Diagnosis not present

## 2020-08-13 ENCOUNTER — Telehealth: Payer: Self-pay | Admitting: Family Medicine

## 2020-08-13 NOTE — Telephone Encounter (Signed)
Called and left vm for the patient to call and reschedule cpx medicare visit as Dr Darnell Level is not in clinic.

## 2020-09-15 ENCOUNTER — Other Ambulatory Visit: Payer: Self-pay

## 2020-09-15 ENCOUNTER — Ambulatory Visit (INDEPENDENT_AMBULATORY_CARE_PROVIDER_SITE_OTHER): Payer: Medicare Other | Admitting: Podiatry

## 2020-09-15 DIAGNOSIS — B351 Tinea unguium: Secondary | ICD-10-CM

## 2020-09-15 DIAGNOSIS — M79674 Pain in right toe(s): Secondary | ICD-10-CM

## 2020-09-15 DIAGNOSIS — M79675 Pain in left toe(s): Secondary | ICD-10-CM | POA: Diagnosis not present

## 2020-09-15 NOTE — Progress Notes (Signed)
   SUBJECTIVE Patient presents to office today complaining of elongated, thickened nails that cause pain while ambulating in shoes.  He is unable to trim his own nails.  He has been taking the oral Lamisil as prescribed.  Patient is here for further evaluation and treatment.  Past Medical History:  Diagnosis Date  . Adenomatous colon polyp 2012  . Cataract 2019   bilateral eyes  . Dehydration 03/2011   ER for syncope attributed to dehydration  . Diverticulosis    by colonoscopy  . Hemorrhoids   . Hyperlipidemia     OBJECTIVE General Patient is awake, alert, and oriented x 3 and in no acute distress. Derm Skin is dry and supple bilateral. Negative open lesions or macerations. Remaining integument unremarkable. Nails are tender, long, thickened and dystrophic with subungual debris, consistent with onychomycosis, 1-5 bilateral. No signs of infection noted. Vasc  DP and PT pedal pulses palpable bilaterally. Temperature gradient within normal limits.  Neuro Epicritic and protective threshold sensation grossly intact bilaterally.  Musculoskeletal Exam No symptomatic pedal deformities noted bilateral. Muscular strength within normal limits.  ASSESSMENT 1. Onychodystrophic nails 1-5 bilateral with hyperkeratosis of nails.  2. Onychomycosis of nail due to dermatophyte bilateral 3. Pain in foot bilateral  PLAN OF CARE 1. Patient evaluated today.  2. Instructed to maintain good pedal hygiene and foot care.  3. Mechanical debridement of nails 1-5 bilaterally performed using a nail nipper. Filed with dremel without incident.  4.  Continue oral Lamisil 250 mg #90 as prescribed. 5.  Return to clinic in 3 months.  At this time we will reevaluate and see if the patient would need to do another 90-day supply of Lamisil pending hepatic function panel   Edrick Kins, DPM [facility]  Dr. Edrick Kins, [address]                                          [place], [place] [address]                Office [phone]  Fax [phone]

## 2020-09-20 ENCOUNTER — Other Ambulatory Visit: Payer: Self-pay | Admitting: Family Medicine

## 2020-09-20 DIAGNOSIS — R739 Hyperglycemia, unspecified: Secondary | ICD-10-CM

## 2020-09-20 DIAGNOSIS — Z125 Encounter for screening for malignant neoplasm of prostate: Secondary | ICD-10-CM

## 2020-09-20 DIAGNOSIS — E7800 Pure hypercholesterolemia, unspecified: Secondary | ICD-10-CM

## 2020-09-21 ENCOUNTER — Other Ambulatory Visit: Payer: Self-pay

## 2020-09-21 ENCOUNTER — Other Ambulatory Visit (INDEPENDENT_AMBULATORY_CARE_PROVIDER_SITE_OTHER): Payer: Medicare Other

## 2020-09-21 ENCOUNTER — Ambulatory Visit: Payer: Medicare Other

## 2020-09-21 DIAGNOSIS — E7800 Pure hypercholesterolemia, unspecified: Secondary | ICD-10-CM | POA: Diagnosis not present

## 2020-09-21 DIAGNOSIS — R739 Hyperglycemia, unspecified: Secondary | ICD-10-CM

## 2020-09-21 DIAGNOSIS — Z125 Encounter for screening for malignant neoplasm of prostate: Secondary | ICD-10-CM

## 2020-09-21 LAB — COMPREHENSIVE METABOLIC PANEL
ALT: 12 U/L (ref 0–53)
AST: 17 U/L (ref 0–37)
Albumin: 4.1 g/dL (ref 3.5–5.2)
Alkaline Phosphatase: 63 U/L (ref 39–117)
BUN: 12 mg/dL (ref 6–23)
CO2: 31 mEq/L (ref 19–32)
Calcium: 9.4 mg/dL (ref 8.4–10.5)
Chloride: 103 mEq/L (ref 96–112)
Creatinine, Ser: 1.06 mg/dL (ref 0.40–1.50)
GFR: 70.65 mL/min (ref >60.00)
Glucose, Bld: 100 mg/dL — ABNORMAL HIGH (ref 70–99)
Potassium: 4.6 mEq/L (ref 3.5–5.1)
Sodium: 140 mEq/L (ref 135–145)
Total Bilirubin: 0.6 mg/dL (ref 0.2–1.2)
Total Protein: 6.8 g/dL (ref 6.0–8.3)

## 2020-09-21 LAB — LIPID PANEL
Cholesterol: 151 mg/dL (ref 0–200)
HDL: 39.1 mg/dL (ref >39.00)
LDL Cholesterol: 90 mg/dL (ref 0–99)
NonHDL: 111.87
Total CHOL/HDL Ratio: 4
Triglycerides: 110 mg/dL (ref 0.0–149.0)
VLDL: 22 mg/dL (ref 0.0–40.0)

## 2020-09-21 LAB — HEMOGLOBIN A1C: Hgb A1c MFr Bld: 5.7 % (ref 4.6–6.5)

## 2020-09-21 LAB — PSA: PSA: 1.45 ng/mL (ref 0.10–4.00)

## 2020-09-24 ENCOUNTER — Encounter: Payer: Medicare Other | Admitting: Family Medicine

## 2020-09-29 ENCOUNTER — Ambulatory Visit (INDEPENDENT_AMBULATORY_CARE_PROVIDER_SITE_OTHER)
Admission: RE | Admit: 2020-09-29 | Discharge: 2020-09-29 | Disposition: A | Discharge status: Home / Self Care | Payer: Medicare Other | Source: Ambulatory Visit | Attending: Family Medicine | Admitting: Family Medicine

## 2020-09-29 ENCOUNTER — Ambulatory Visit (INDEPENDENT_AMBULATORY_CARE_PROVIDER_SITE_OTHER): Payer: Medicare Other | Admitting: Family Medicine

## 2020-09-29 ENCOUNTER — Other Ambulatory Visit: Payer: Self-pay

## 2020-09-29 ENCOUNTER — Encounter: Payer: Self-pay | Admitting: Family Medicine

## 2020-09-29 VITALS — BP 132/66 | HR 76 | Temp 97.6°F | Ht 67.0 in | Wt 160.4 lb

## 2020-09-29 DIAGNOSIS — R739 Hyperglycemia, unspecified: Secondary | ICD-10-CM

## 2020-09-29 DIAGNOSIS — E7800 Pure hypercholesterolemia, unspecified: Secondary | ICD-10-CM | POA: Diagnosis not present

## 2020-09-29 DIAGNOSIS — Z0000 Encounter for general adult medical examination without abnormal findings: Secondary | ICD-10-CM | POA: Diagnosis not present

## 2020-09-29 DIAGNOSIS — Z7189 Other specified counseling: Secondary | ICD-10-CM

## 2020-09-29 DIAGNOSIS — M25541 Pain in joints of right hand: Secondary | ICD-10-CM

## 2020-09-29 MED ORDER — PRAVASTATIN SODIUM 40 MG PO TABS
40.0000 mg | ORAL_TABLET | Freq: Every day | ORAL | 3 refills | Status: DC
Start: 2020-09-29 — End: 2021-10-04

## 2020-09-29 NOTE — Assessment & Plan Note (Signed)

## 2020-09-29 NOTE — Assessment & Plan Note (Signed)
Stable period, borderline prediabetes.

## 2020-09-29 NOTE — Assessment & Plan Note (Addendum)
Pain and stiffness noted today, possible osteoarthritis, ?ganglion cyst contributing. Check baseline xrays, rec voltaren gel to joint, and if not improving discussed referral to hand surgery for evaluation. Pt agrees with plan.

## 2020-09-29 NOTE — Assessment & Plan Note (Signed)
Preventative protocols reviewed and updated unless pt declined. Discussed healthy diet and lifestyle.  

## 2020-09-29 NOTE — Assessment & Plan Note (Signed)
Advanced directives: completed this year. Oldest daughter Leanord Hawking is HCPOA.advised to bring [place] a copy.

## 2020-09-29 NOTE — Assessment & Plan Note (Signed)
Chronic, stable on pravastatin. The 10-year ASCVD risk score Mikey Bussing DC Brooke Bonito., et al., 2013) is: 19.7%   Values used to calculate the score:     Age: 69 years     Sex: Male     Is Non-Hispanic African American: No     Diabetic: No     Tobacco smoker: No     Systolic Blood Pressure: 161 mmHg     Is BP treated: No     HDL Cholesterol: 39.1 mg/dL     Total Cholesterol: 151 mg/dL

## 2020-09-29 NOTE — Patient Instructions (Addendum)
Check on date of second shingrix vaccine - let us know to update your chart.  Work on living will.  Xray of right hand today. Start using voltaren gel to affected joint.  If no better with voltaren, let me know for referral to hand surgeon for further evaluation.  You are doing well today. Return as needed or in 1 year for next physical.   Health Maintenance After Age 71 After age 36, you are at a higher risk for certain long-term diseases and infections as well as injuries from falls. Falls are a major cause of broken bones and head injuries in people who are older than age 70. Getting regular preventive care can help to keep you healthy and well. Preventive care includes getting regular testing and making lifestyle changes as recommended by your health care provider. Talk with your health care provider about:  Which screenings and tests you should have. A screening is a test that checks for a disease when you have no symptoms.  A diet and exercise plan that is right for you. What should I know about screenings and tests to prevent falls? Screening and testing are the best ways to find a health problem early. Early diagnosis and treatment give you the best chance of managing medical conditions that are common after age 36. Certain conditions and lifestyle choices may make you more likely to have a fall. Your health care provider may recommend:  Regular vision checks. Poor vision and conditions such as cataracts can make you more likely to have a fall. If you wear glasses, make sure to get your prescription updated if your vision changes.  Medicine review. Work with your health care provider to regularly review all of the medicines you are taking, including over-the-counter medicines. Ask your health care provider about any side effects that may make you more likely to have a fall. Tell your health care provider if any medicines that you take make you feel dizzy or sleepy.  Osteoporosis screening.  Osteoporosis is a condition that causes the bones to get weaker. This can make the bones weak and cause them to break more easily.  Blood pressure screening. Blood pressure changes and medicines to control blood pressure can make you feel dizzy.  Strength and balance checks. Your health care provider may recommend certain tests to check your strength and balance while standing, walking, or changing positions.  Foot health exam. Foot pain and numbness, as well as not wearing proper footwear, can make you more likely to have a fall.  Depression screening. You may be more likely to have a fall if you have a fear of falling, feel emotionally low, or feel unable to do activities that you used to do.  Alcohol use screening. Using too much alcohol can affect your balance and may make you more likely to have a fall. What actions can I take to lower my risk of falls? General instructions  Talk with your health care provider about your risks for falling. Tell your health care provider if: ? You fall. Be sure to tell your health care provider about all falls, even ones that seem minor. ? You feel dizzy, sleepy, or off-balance.  Take over-the-counter and prescription medicines only as told by your health care provider. These include any supplements.  Eat a healthy diet and maintain a healthy weight. A healthy diet includes low-fat dairy products, low-fat (lean) meats, and fiber from whole grains, beans, and lots of fruits and vegetables. Home safety  Remove  any tripping hazards, such as rugs, cords, and clutter.  Install safety equipment such as grab bars in bathrooms and safety rails on stairs.  Keep rooms and walkways well-lit. Activity   Follow a regular exercise program to stay fit. This will help you maintain your balance. Ask your health care provider what types of exercise are appropriate for you.  If you need a cane or walker, use it as recommended by your health care provider.  Wear  supportive shoes that have nonskid soles. Lifestyle  Do not drink alcohol if your health care provider tells you not to drink.  If you drink alcohol, limit how much you have: ? 0-1 drink a day for women. ? 0-2 drinks a day for men.  Be aware of how much alcohol is in your drink. In the [place] one drink equals one typical bottle of beer (12 oz), one-half glass of wine (5 oz), or one shot of hard liquor (1 oz).  Do not use any products that contain nicotine or tobacco, such as cigarettes and e-cigarettes. If you need help quitting, ask your health care provider. Summary  Having a healthy lifestyle and getting preventive care can help to protect your health and wellness after age 62.  Screening and testing are the best way to find a health problem early and help you avoid having a fall. Early diagnosis and treatment give you the best chance for managing medical conditions that are more common for people who are older than age 70.  Falls are a major cause of broken bones and head injuries in people who are older than age 64. Take precautions to prevent a fall at home.  Work with your health care provider to learn what changes you can make to improve your health and wellness and to prevent falls. This information is not intended to replace advice given to you by your health care provider. Make sure you discuss any questions you have with your health care provider. Document Revised: 02/14/2019 Document Reviewed: 09/06/2017 Elsevier Patient Education  2020 [employer].

## 2020-09-29 NOTE — Progress Notes (Signed)
Patient ID: Bryan Barker, male    DOB: 04/06/1951, 69 y.o.   MRN: [id number]  This visit was conducted in person.  BP 132/66 (BP Location: Left Arm, Patient Position: Sitting, Cuff Size: Normal)   Pulse 76   Temp 97.6 F (36.4 C) (Temporal)   Ht 5\' 7"  (1.702 m)   Wt 160 lb 6 oz (72.7 kg)   SpO2 99%   BMI 25.12 kg/m    CC: CPE/AMW Subjective:   HPI: Bryan Barker is a 69 y.o. male presenting on 09/29/2020 for Medicare Wellness   Did not see health advisor this year.    Hearing Screening   125Hz  250Hz  500Hz  1000Hz  2000Hz  3000Hz  4000Hz  6000Hz  8000Hz   Right ear:   20 25 40  0    Left ear:   20 25 40  0    Vision Screening Comments: Last eye exam, 09/2020.    Office Visit from 09/29/2020 in [place] at [facility]  PHQ-2 Total Score 0      Fall Risk  09/29/2020 09/12/2019 09/05/2018 09/04/2017 08/25/2016  Falls in the past year? 0 0 No No No  Number falls in past yr: - 0 - - -  Injury with Fall? - 0 - - -  Follow up - Falls evaluation completed;Falls prevention discussed - - -   1 month history of R 2nd MCP pain worse at night time, affecting ability to grip hand closed, some stiffness. Worse with cold water. No redness/warmth. No other joints affected, no rashes.   Preventative: COLONOSCOPY Date: 10/2014 sig diverticulosis, rpt 5 yrs Carlean Purl) COLONOSCOPY 10/2019 - 2 TA, diverticulosis, rpt 5 yrs Carlean Purl) Prostate screening - PSA has decreased again. Gets yearly checks. No nocturia. Strong stream.  Flu shot yearly  prevnar 08/2014, pneumovax 2016 [place] 12[place]2019[place] [facility]  Tetanus 08/2016 zostavax- 06/2012 shingrix - 09/2019  Advanced directives: completed this year. Oldest daughter Leanord Hawking is HCPOA.advised to bring [place] a copy.  Seat belt use discussed.  Sunscreen use discussed. Denies changing moles. 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 Interim medical history since our last visit reviewed. Allergies and medications reviewed and updated. Outpatient Medications Prior to Visit  Medication Sig Dispense Refill  . aspirin 81 MG tablet Take 81 mg by mouth daily.    [name] Kitchen docusate sodium (COLACE) 100 MG capsule Take 100 mg by mouth daily.    . Psyllium (METAMUCIL PO) Take by mouth daily.    . pravastatin (PRAVACHOL) 40 MG tablet Take 1 tablet (40 mg total) by mouth daily. 90 tablet 3  . hydrocortisone (ANUSOL-HC) 2.5 % rectal cream Place 1 application rectally 2 (two) times daily. 30 g 0  . terbinafine (LAMISIL) 250 MG tablet Take 1 tablet (250 mg total) by mouth daily. 90 tablet 0  . triamcinolone cream (KENALOG) 0.1 % Apply 1 application topically 2 (two) times daily. Use no more than 10 days in a row. 30 g 0   No facility-administered medications prior to visit.     Per HPI unless  specifically indicated in ROS section below Review of Systems  Constitutional: Negative for activity change, appetite change, chills, fatigue, fever and unexpected weight change.  HENT: Negative for hearing loss.   Eyes: Negative for visual disturbance.  Respiratory: Negative for cough, chest tightness, shortness of breath and wheezing.   Cardiovascular: Negative for chest pain, palpitations and leg swelling.  Gastrointestinal: Negative for abdominal distention, abdominal pain, blood in stool, constipation, diarrhea, nausea and vomiting.  Genitourinary: Negative for difficulty urinating and hematuria.  Musculoskeletal: Positive for  arthralgias. Negative for myalgias and neck pain.  Skin: Negative for rash.  Neurological: Negative for dizziness, seizures, syncope and headaches.  Hematological: Negative for adenopathy. Does not bruise/bleed easily.  Psychiatric/Behavioral: Negative for dysphoric mood. The patient is not nervous/anxious.    Objective:  BP 132/66 (BP Location: Left Arm, Patient Position: Sitting, Cuff Size: Normal)   Pulse 76   Temp 97.6 F (36.4 C) (Temporal)   Ht 5\' 7"  (1.702 m)   Wt 160 lb 6 oz (72.7 kg)   SpO2 99%   BMI 25.12 kg/m   Wt Readings from Last 3 Encounters:  09/29/20 160 lb 6 oz (72.7 kg)  10/25/19 163 lb (73.9 kg)  10/16/19 163 lb 9.6 oz (74.2 kg)      Physical Exam Vitals and nursing note reviewed.  Constitutional:      General: He is not in acute distress.    Appearance: Normal appearance. He is well-developed. He is not ill-appearing.  HENT:     Right Ear: Hearing, tympanic membrane, ear canal and external ear normal.     Left Ear: Hearing, tympanic membrane, ear canal and external ear normal.  Eyes:     General: No scleral icterus.    Extraocular Movements: Extraocular movements intact.     Conjunctiva/sclera: Conjunctivae normal.     Pupils: Pupils are equal, round, and reactive to light.  Neck:     Thyroid: No thyroid mass or thyromegaly.     Vascular: No carotid bruit.  Cardiovascular:     Rate and Rhythm: Normal rate and regular rhythm.     Pulses: Normal pulses.          Radial pulses are 2+ on the right side and 2+ on the left side.     Heart sounds: Normal heart sounds. No murmur heard.   Pulmonary:     Effort: Pulmonary effort is normal. No respiratory distress.     Breath sounds: Normal breath sounds. No wheezing, rhonchi or rales.  Abdominal:     General: Abdomen is flat. Bowel sounds are normal. There is no distension.     Palpations: Abdomen is soft. There is no mass.     Tenderness: There is no abdominal tenderness. There is no guarding or rebound.      Hernia: No hernia is present.  Musculoskeletal:        General: Swelling and tenderness present. Normal range of motion.     Cervical back: Normal range of motion and neck supple.     Right lower leg: No edema.     Left lower leg: No edema.     Comments:  FROM L hand at joints R hand - limited ROM in flexion at 2nd MCP joint due to stiffness and pain, tender nodule present ulnar side of dorsal 2nd MCPJ   Lymphadenopathy:     Cervical: No cervical adenopathy.  Skin:    General: Skin is warm and dry.     Findings: No rash.  Neurological:  General: No focal deficit present.     Mental Status: He is alert and oriented to person, place, and time.     Comments:  CN grossly intact, station and gait intact Recall 2/3, 3/3 with cue Calculation 5/5 DLROW  Psychiatric:        Mood and Affect: Mood normal.        Behavior: Behavior normal.        Thought Content: Thought content normal.        Judgment: Judgment normal.       Results for orders placed or performed in visit on 09/21/20  PSA  Result Value Ref Range   PSA 1.45 0.10 - 4.00 ng/mL  Hemoglobin A1c  Result Value Ref Range   Hgb A1c MFr Bld 5.7 4.6 - 6.5 %  Comprehensive metabolic panel  Result Value Ref Range   Sodium 140 135 - 145 mEq/L   Potassium 4.6 3.5 - 5.1 mEq/L   Chloride 103 96 - 112 mEq/L   CO2 31 19 - 32 mEq/L   Glucose, Bld 100 (H) 70 - 99 mg/dL   BUN 12 6 - 23 mg/dL   Creatinine, Ser 1.06 0.40 - 1.50 mg/dL   Total Bilirubin 0.6 0.2 - 1.2 mg/dL   Alkaline Phosphatase 63 39 - 117 U/L   AST 17 0 - 37 U/L   ALT 12 0 - 53 U/L   Total Protein 6.8 6.0 - 8.3 g/dL   Albumin 4.1 3.5 - 5.2 g/dL   GFR 70.65 >60.00 mL/min   Calcium 9.4 8.4 - 10.5 mg/dL  Lipid panel  Result Value Ref Range   Cholesterol 151 0 - 200 mg/dL   Triglycerides 110.0 0 - 149 mg/dL   HDL 39.10 >39.00 mg/dL   VLDL 22.0 0.0 - 40.0 mg/dL   LDL Cholesterol 90 0 - 99 mg/dL   Total CHOL/HDL Ratio 4    NonHDL 111.87    Assessment &  Plan:  This visit occurred during the SARS-CoV-2 public health emergency.  Safety protocols were in place, including screening questions prior to the visit, additional usage of staff PPE, and extensive cleaning of exam room while observing appropriate contact time as indicated for disinfecting solutions.   Problem List Items Addressed This Visit    Metacarpophalangeal joint pain of right hand    Pain and stiffness noted today, possible osteoarthritis, ?ganglion cyst contributing. Check baseline xrays, rec voltaren gel to joint, and if not improving discussed referral to hand surgery for evaluation. Pt agrees with plan.       Relevant Orders   DG Hand Complete Right   Medicare annual wellness visit, subsequent - Primary    I have personally reviewed the Medicare Annual Wellness questionnaire and have noted 1. The patient's medical and social history 2. Their use of alcohol, tobacco or illicit drugs 3. Their current medications and supplements 4. The patient's functional ability including ADL's, fall risks, home safety risks and hearing or visual impairment. Cognitive function has been assessed and addressed as indicated.  5. Diet and physical activity 6. Evidence for depression or mood disorders The patients weight, height, BMI have been recorded in the chart. I have made referrals, counseling and provided education to the patient based on review of the above and I have provided the pt with a written personalized care plan for preventive services. Provider list updated.. See scanned questionairre as needed for further documentation. Reviewed preventative protocols and updated unless pt declined.  Hyperglycemia    Stable period, borderline prediabetes.       Hypercholesteremia    Chronic, stable on pravastatin. The 10-year ASCVD risk score Mikey Bussing DC Brooke Bonito., et al., 2013) is: 19.7%   Values used to calculate the score:     Age: 51 years     Sex: Male     Is Non-Hispanic African  American: No     Diabetic: No     Tobacco smoker: No     Systolic Blood Pressure: 409 mmHg     Is BP treated: No     HDL Cholesterol: 39.1 mg/dL     Total Cholesterol: 151 mg/dL       Relevant Medications   pravastatin (PRAVACHOL) 40 MG tablet   Healthcare maintenance    Preventative protocols reviewed and updated unless pt declined. Discussed healthy diet and lifestyle.       Advanced care planning/counseling discussion    Advanced directives: completed this year. Oldest daughter Leanord Hawking is HCPOA.advised to bring [place] a copy.           Meds ordered this encounter  Medications  . pravastatin (PRAVACHOL) 40 MG tablet    Sig: Take 1 tablet (40 mg total) by mouth daily.    Dispense:  90 tablet    Refill:  3   Orders Placed This Encounter  Procedures  . DG Hand Complete Right    Standing Status:   Future    Standing Expiration Date:   09/29/2021    Order Specific Question:   Reason for Exam (SYMPTOM  OR DIAGNOSIS REQUIRED)    Answer:   R 2nd MTP pain/swelling eval cyst    Order Specific Question:   Preferred imaging location?    Answer:   [facility]    Patient instructions: Check on date of second shingrix vaccine - let us know to update your chart.  Work on living will.  Xray of right hand today. Start using voltaren gel to affected joint.  If no better with voltaren, let me know for referral to hand surgeon for further evaluation.  You are doing well today. Return as needed or in 1 year for next physical.   Follow up plan: Return in about 1 year (around 09/29/2021) for annual exam, prior fasting for blood work, medicare wellness visit.  Ria Bush, MD

## 2020-10-06 ENCOUNTER — Ambulatory Visit: Payer: Medicare Other

## 2020-12-18 ENCOUNTER — Encounter: Payer: Self-pay | Admitting: Podiatry

## 2020-12-18 ENCOUNTER — Other Ambulatory Visit: Payer: Self-pay

## 2020-12-18 ENCOUNTER — Ambulatory Visit (INDEPENDENT_AMBULATORY_CARE_PROVIDER_SITE_OTHER): Payer: Medicare Other | Admitting: Podiatry

## 2020-12-18 DIAGNOSIS — M79675 Pain in left toe(s): Secondary | ICD-10-CM

## 2020-12-18 DIAGNOSIS — M79674 Pain in right toe(s): Secondary | ICD-10-CM

## 2020-12-18 DIAGNOSIS — B351 Tinea unguium: Secondary | ICD-10-CM

## 2020-12-18 NOTE — Progress Notes (Signed)
   SUBJECTIVE Patient presents to office today complaining of elongated, thickened nails that cause pain while ambulating in shoes.  He is unable to trim his own nails. Patient is here for further evaluation and treatment.  Past Medical History:  Diagnosis Date  . Adenomatous colon polyp 2012  . Cataract 2019   bilateral eyes  . Dehydration 03/2011   ER for syncope attributed to dehydration  . Diverticulosis    by colonoscopy  . Hemorrhoids   . Hyperlipidemia     OBJECTIVE General Patient is awake, alert, and oriented x 3 and in no acute distress. Derm Skin is dry and supple bilateral. Negative open lesions or macerations. Remaining integument unremarkable. Nails are tender, long, thickened and dystrophic with subungual debris, consistent with onychomycosis, 1-5 bilateral. No signs of infection noted. Vasc  DP and PT pedal pulses palpable bilaterally. Temperature gradient within normal limits.  Neuro Epicritic and protective threshold sensation grossly intact bilaterally.  Musculoskeletal Exam No symptomatic pedal deformities noted bilateral. Muscular strength within normal limits.  ASSESSMENT 1. Onychodystrophic nails 1-5 bilateral with hyperkeratosis of nails.  2. Onychomycosis of nail due to dermatophyte bilateral 3. Pain in foot bilateral  PLAN OF CARE 1. Patient evaluated today.  2. Instructed to maintain good pedal hygiene and foot care.  3. Mechanical debridement of nails 1-5 bilaterally performed using a nail nipper. Filed with dremel without incident.  4. Return to clinic in 3 mos.   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    Edrick Kins, DPM [facility]  Dr. Edrick Kins, [place]                                        [place], [place] [address]                Office [phone]  Fax [phone]

## 2021-03-19 ENCOUNTER — Encounter: Payer: Self-pay | Admitting: Podiatry

## 2021-03-19 ENCOUNTER — Ambulatory Visit (INDEPENDENT_AMBULATORY_CARE_PROVIDER_SITE_OTHER): Payer: Medicare Other | Admitting: Podiatry

## 2021-03-19 ENCOUNTER — Other Ambulatory Visit: Payer: Self-pay

## 2021-03-19 DIAGNOSIS — B351 Tinea unguium: Secondary | ICD-10-CM | POA: Diagnosis not present

## 2021-03-19 DIAGNOSIS — M79675 Pain in left toe(s): Secondary | ICD-10-CM

## 2021-03-19 DIAGNOSIS — M79674 Pain in right toe(s): Secondary | ICD-10-CM

## 2021-03-19 NOTE — Progress Notes (Signed)
   SUBJECTIVE °Patient presents to office today complaining of elongated, thickened nails that cause pain while ambulating in shoes.  He is unable to trim his own nails. Patient is here for further evaluation and treatment. ° °Past Medical History:  °Diagnosis Date  ° Adenomatous colon polyp 2012  ° Cataract 2019  ° bilateral eyes  ° Dehydration 03/2011  ° ER for syncope attributed to dehydration  ° Diverticulosis   ° by colonoscopy  ° Hemorrhoids   ° Hyperlipidemia   ° ° °OBJECTIVE °General Patient is awake, alert, and oriented x 3 and in no acute distress. °Derm Skin is dry and supple bilateral. Negative open lesions or macerations. Remaining integument unremarkable. Nails are tender, long, thickened and dystrophic with subungual debris, consistent with onychomycosis, 1-5 bilateral. No signs of infection noted. °Vasc  DP and PT pedal pulses palpable bilaterally. Temperature gradient within normal limits.  °Neuro Epicritic and protective threshold sensation grossly intact bilaterally.  °Musculoskeletal Exam No symptomatic pedal deformities noted bilateral. Muscular strength within normal limits. ° °ASSESSMENT °1. Onychodystrophic nails 1-5 bilateral with hyperkeratosis of nails.  °2. Onychomycosis of nail due to dermatophyte bilateral °3. Pain in foot bilateral ° °PLAN OF CARE °1. Patient evaluated today.  °2. Instructed to maintain good pedal hygiene and foot care.  °3. Mechanical debridement of nails 1-5 bilaterally performed using a nail nipper. Filed with dremel without incident.  °4. Return to clinic in 3 mos.  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  ° ° °Shellie Goettl M. Jaleena Viviani, DPM °[facility] ° °Dr. Amilcar Reever M. Kyndal Heringer, DPM  °  °[address]                                   °[place], [place] [address]                °Office [phone]  °Fax [phone] ° ° ° ° °

## 2021-06-18 ENCOUNTER — Ambulatory Visit (INDEPENDENT_AMBULATORY_CARE_PROVIDER_SITE_OTHER): Payer: Medicare Other | Admitting: Podiatry

## 2021-06-18 ENCOUNTER — Other Ambulatory Visit: Payer: Self-pay

## 2021-06-18 DIAGNOSIS — M79675 Pain in left toe(s): Secondary | ICD-10-CM

## 2021-06-18 DIAGNOSIS — M79674 Pain in right toe(s): Secondary | ICD-10-CM | POA: Diagnosis not present

## 2021-06-18 DIAGNOSIS — B351 Tinea unguium: Secondary | ICD-10-CM | POA: Diagnosis not present

## 2021-06-18 NOTE — Progress Notes (Signed)
   SUBJECTIVE Patient presents to office today complaining of elongated, thickened nails that cause pain while ambulating in shoes.  He is unable to trim his own nails. Patient is here for further evaluation and treatment.  Past Medical History:  Diagnosis Date   Adenomatous colon polyp 2012   Cataract 2019   bilateral eyes   Dehydration 03/2011   ER for syncope attributed to dehydration   Diverticulosis    by colonoscopy   Hemorrhoids    Hyperlipidemia     OBJECTIVE General Patient is awake, alert, and oriented x 3 and in no acute distress. Derm Skin is dry and supple bilateral. Negative open lesions or macerations. Remaining integument unremarkable. Nails are tender, long, thickened and dystrophic with subungual debris, consistent with onychomycosis, 1-5 bilateral. No signs of infection noted. Vasc  DP and PT pedal pulses palpable bilaterally. Temperature gradient within normal limits.  Neuro Epicritic and protective threshold sensation grossly intact bilaterally.  Musculoskeletal Exam No symptomatic pedal deformities noted bilateral. Muscular strength within normal limits.  ASSESSMENT 1. Onychodystrophic nails 1-5 bilateral with hyperkeratosis of nails.  2. Onychomycosis of nail due to dermatophyte bilateral 3. Pain in foot bilateral  PLAN OF CARE 1. Patient evaluated today.  2. Instructed to maintain good pedal hygiene and foot care.  3. Mechanical debridement of nails 1-5 bilaterally performed using a nail nipper. Filed with dremel without incident.  4. Return to clinic in 3 mos.   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    Edrick Kins, DPM [facility]  Dr. Edrick Kins, DPM    [address], [place] [address]                Office [phone]  Fax [phone]

## 2021-09-13 ENCOUNTER — Telehealth: Payer: Self-pay | Admitting: Family Medicine

## 2021-09-13 DIAGNOSIS — R739 Hyperglycemia, unspecified: Secondary | ICD-10-CM

## 2021-09-13 DIAGNOSIS — E7800 Pure hypercholesterolemia, unspecified: Secondary | ICD-10-CM

## 2021-09-13 DIAGNOSIS — Z125 Encounter for screening for malignant neoplasm of prostate: Secondary | ICD-10-CM

## 2021-09-13 NOTE — Telephone Encounter (Signed)
Pt called to change lab appt 11/22 to the [place] location. Please enter orders for CPE labs.

## 2021-09-14 NOTE — Telephone Encounter (Signed)
Labs ordered.

## 2021-09-17 ENCOUNTER — Other Ambulatory Visit: Payer: Self-pay

## 2021-09-17 ENCOUNTER — Ambulatory Visit (INDEPENDENT_AMBULATORY_CARE_PROVIDER_SITE_OTHER): Payer: Medicare Other | Admitting: Podiatry

## 2021-09-17 DIAGNOSIS — B351 Tinea unguium: Secondary | ICD-10-CM

## 2021-09-17 DIAGNOSIS — M79674 Pain in right toe(s): Secondary | ICD-10-CM

## 2021-09-17 DIAGNOSIS — M79675 Pain in left toe(s): Secondary | ICD-10-CM | POA: Diagnosis not present

## 2021-09-17 NOTE — Progress Notes (Signed)
   SUBJECTIVE Patient presents to office today complaining of elongated, thickened nails that cause pain while ambulating in shoes.  He is unable to trim his own nails. Patient is here for further evaluation and treatment.  Past Medical History:  Diagnosis Date   Adenomatous colon polyp 2012   Cataract 2019   bilateral eyes   Dehydration 03/2011   ER for syncope attributed to dehydration   Diverticulosis    by colonoscopy   Hemorrhoids    Hyperlipidemia     OBJECTIVE General Patient is awake, alert, and oriented x 3 and in no acute distress. Derm Skin is dry and supple bilateral. Negative open lesions or macerations. Remaining integument unremarkable. Nails are tender, long, thickened and dystrophic with subungual debris, consistent with onychomycosis, 1-5 bilateral. No signs of infection noted. Vasc  DP and PT pedal pulses palpable bilaterally. Temperature gradient within normal limits.  Neuro Epicritic and protective threshold sensation grossly intact bilaterally.  Musculoskeletal Exam No symptomatic pedal deformities noted bilateral. Muscular strength within normal limits.  ASSESSMENT 1. Onychodystrophic nails 1-5 bilateral with hyperkeratosis of nails.  2. Onychomycosis of nail due to dermatophyte bilateral 3. Pain in foot bilateral  PLAN OF CARE 1. Patient evaluated today.  2. Instructed to maintain good pedal hygiene and foot care.  3. Mechanical debridement of nails 1-5 bilaterally performed using a nail nipper. Filed with dremel without incident.  4. Return to clinic in 3 mos.   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    Edrick Kins, DPM [facility]  Dr. Edrick Kins, DPM    [address], [place] [address]                Office [phone]  Fax [phone]

## 2021-09-26 NOTE — Progress Notes (Signed)
Subjective:   Bryan Barker is a 70 y.o. male who presents for Medicare Annual/Subsequent preventive examination.  I connected with Bryan Barker today by telephone and verified that I am speaking with the correct person using two identifiers. Location patient: home Location provider: work Persons participating in the virtual visit: patient, Marine scientist.    I discussed the limitations, risks, security and privacy concerns of performing an evaluation and management service by telephone and the availability of in person appointments. I also discussed with the patient that there may be a patient responsible charge related to this service. The patient expressed understanding and verbally consented to this telephonic visit.    Interactive audio and video telecommunications were attempted between this provider and patient, however failed, due to patient having technical difficulties OR patient did not have access to video capability.  We continued and completed visit with audio only.  Some vital signs may be absent or patient reported.   Time Spent with patient on telephone encounter: 25 minutes  Review of Systems     Cardiac Risk Factors include: advanced age (>21men, >39 women);dyslipidemia     Objective:    Today's Vitals   09/28/21 1313  Weight: 160 lb (72.6 kg)  Height: 5\' 7"  (1.702 m)   Body mass index is 25.06 kg/m.  Advanced Directives 09/28/2021 10/25/2019 09/12/2019 09/05/2018 09/04/2017 08/25/2016  Does Patient Have a Medical Advance Directive? Yes No No No No No  Type of Paramedic of [place];Living will - - - - -  Does patient want to make changes to medical advance directive? Yes (MAU/Ambulatory/Procedural Areas - Information given) - - - - -  Would patient like information on creating a medical advance directive? - No - Patient declined Yes (MAU/Ambulatory/Procedural Areas - Information given) Yes (MAU/Ambulatory/Procedural Areas - Information given) No  - Patient declined Yes - Educational materials given    Current Medications (verified) Outpatient Encounter Medications as of 09/28/2021  Medication Sig   aspirin 81 MG tablet Take 81 mg by mouth daily.   docusate sodium (COLACE) 100 MG capsule Take 100 mg by mouth daily.   pravastatin (PRAVACHOL) 40 MG tablet Take 1 tablet (40 mg total) by mouth daily.   Psyllium (METAMUCIL PO) Take by mouth daily.   No facility-administered encounter medications on file as of 09/28/2021.    Allergies (verified) Patient has no known allergies.   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 (Murphy/Wainer)    Family History  Problem Relation Age of Onset   Diabetes Mother    Hypertension Mother    Heart disease Mother        Pacer   Colon polyps Mother    Coronary artery disease Father 87       MI   Cancer Brother        esoph, mets to brain  Diabetes Brother    Alcohol abuse Sister    Colon cancer Neg Hx    Stroke Neg Hx    Esophageal cancer Neg Hx    Stomach cancer Neg Hx    Rectal cancer Neg Hx    Social History   Socioeconomic History   Marital status: Married    Spouse name: Not on file   Number of children: 2   Years of education: Not on file   Highest education level: Not on file  Occupational History   Occupation: retired    Comment: retiring 2/11  Tobacco Use   Smoking status: Former    Packs/day: 1.00     Years: 3.00    Pack years: 3.00    Types: Cigarettes    Quit date: 06/16/1968    Years since quitting: 53.3   Smokeless tobacco: Never   Tobacco comments:    only smoked as a teenager  Vaping Use   Vaping Use: Never used  Substance and Sexual Activity   Alcohol use: No    Alcohol/week: 0.0 standard drinks   Drug use: No   Sexual activity: Yes  Other Topics Concern   Not on file  Social History Narrative   Caffeine: 1 cup/day   Married and lives with wife, 1 dog, cows   2 daughters   Occupation: retired, was Presenter, 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, 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   Diet: good water, fruits/vegetables daily   Social Determinants of Radio broadcast assistant Strain: Low Risk    Difficulty of Paying Living Expenses: Not hard at all  Food Insecurity: No Food Insecurity   Worried About Charity fundraiser in the Last Year: Never true   Arboriculturist in the Last Year: Never true  Transportation Needs: Not on file  Physical Activity: Sufficiently Active   Days of Exercise per Week: 7 days   Minutes of Exercise per Session: 60 min  Stress: No Stress Concern Present   Feeling of Stress : Not at all  Social Connections: Socially Integrated   Frequency of Communication with Friends and Family: More than three times a week   Frequency of Social Gatherings with Friends and Family: More than three times a week   Attends Religious Services: More than 4 times per year   Active Member of [employer] or Organizations: Yes   Attends Music therapist: More than 4 times per year   Marital Status: Married    Tobacco Counseling Counseling given: Not Answered Tobacco comments: only smoked as a teenager   Clinical Intake:  Pre-visit preparation completed: Yes  Pain : No/denies pain     BMI - recorded: 25.06 Nutritional Status: BMI 25 -29 Overweight Nutritional Risks: None Diabetes: No  How often do you need to have someone help you when you read  instructions, pamphlets, or other written materials from your doctor or pharmacy?: 1 - Never  Diabetic?No  Interpreter Needed?: No  Information entered by :: Orrin Brigham LPN   Activities of Daily Living In your present state of health, do you have any difficulty performing the following activities: 09/28/2021  Hearing? N  Vision? N  Difficulty concentrating or making decisions? N  Walking or climbing stairs? N  Dressing or bathing? N  Doing errands, shopping? N  Preparing Food and eating ? N  Using the Toilet? N  In the past six months, have you accidently leaked urine? N  Do you have problems with loss  of bowel control? N  Managing your Medications? N  Managing your Finances? N  Housekeeping or managing your Housekeeping? N  Some recent data might be hidden    Patient Care Team: Ria Bush, MD as PCP - General (Family Medicine) Eula Listen, DDS as Referring Physician (Dentistry) Gatha Mayer, MD as Consulting Physician (Gastroenterology)  Indicate any recent Medical Services you may have received from other than [facility] providers in the past year (date may be approximate).     Assessment:   This is a routine wellness examination for [place].  Hearing/Vision screen Hearing Screening - Comments:: No issues Vision Screening - Comments:: Last exam 09/16/2021, Dr. Edison Pace, wears glasses  Dietary issues and exercise activities discussed: Current Exercise Habits: The patient has a physically strenuous job, but has no regular exercise apart from work.;Home exercise routine, Type of exercise: walking, Time (Minutes): 20, Frequency (Times/Week): 7, Weekly Exercise (Minutes/Week): 140, Intensity: Moderate   Goals Addressed             This Visit's Progress    Patient Stated       Would like to maintain weight.        Depression Screen PHQ 2/9 Scores 09/28/2021 09/29/2020 09/12/2019 09/05/2018 09/04/2017 08/25/2016 09/08/2015  PHQ - 2 Score 0 0 0 0 0 0 0  PHQ-  9 Score - - 0 0 0 - -    Fall Risk Fall Risk  09/28/2021 09/29/2020 09/12/2019 09/05/2018 09/04/2017  Falls in the past year? 0 0 0 No No  Number falls in past yr: 0 - 0 - -  Injury with Fall? 0 - 0 - -  Risk for fall due to : No Fall Risks - - - -  Follow up Falls prevention discussed - Falls evaluation completed;Falls prevention discussed - -    FALL RISK PREVENTION PERTAINING TO THE HOME:  Any stairs in or around the home? Yes  If so, are there any without handrails? No  Home free of loose throw rugs in walkways, pet beds, electrical cords, etc? Yes  Adequate lighting in your home to reduce risk of falls? Yes   ASSISTIVE DEVICES UTILIZED TO PREVENT FALLS:  Life alert? No  Use of a cane, walker or w/c? No  Grab bars in the bathroom? No  Shower chair or bench in shower? No  Elevated toilet seat or a handicapped toilet? No   TIMED UP AND GO:  Was the test performed? No , visit completed over the phone.   Cognitive Function: Normal cognitive status assessed by this Nurse Health Advisor. No abnormalities found.   MMSE - Mini Mental State Exam 09/12/2019 09/05/2018 09/04/2017 08/25/2016  Orientation to time 5 5 5 5   Orientation to Place 5 5 5 5   Registration 3 3 3 3   Attention/ Calculation 5 0 0 0  Recall 3 3 3 3   Language- name 2 objects - 0 0 0  Language- repeat 1 1 1 1   Language- follow 3 step command - 3 3 3   Language- read & follow direction - 0 0 0  Write a sentence - 0 0 0  Copy design - 0 0 0  Total score - 20 20 20         Immunizations Immunization History  Administered Date(s) Administered   Fluad Quad(high Dose 65+) 08/01/2019, 07/23/2020   Influenza Split 08/23/2011, 08/21/2012   Influenza,inj,Quad PF,6+ Mos 08/20/2013, 08/21/2014, 09/08/2015, 08/25/2016, 09/04/2017, 09/05/2018   PFIZER(Purple Top)SARS-COV-2 Vaccination 12/21/2019, 01/13/2020   Pneumococcal Conjugate-13 08/21/2014  Pneumococcal Polysaccharide-23 09/08/2015   Td 06/17/2005, 08/25/2016    Zoster Recombinat (Shingrix) 09/19/2019, 02/16/2020   Zoster, Live 06/28/2012    TDAP status: Up to date  Flu Vaccine status: Due, Education has been provided regarding the importance of this vaccine. Advised may receive this vaccine at local pharmacy or Health Dept. Aware to provide a copy of the vaccination record if obtained from local pharmacy or Health Dept. Verbalized acceptance and understanding.  Pneumococcal vaccine status: Up to date  Covid-19 vaccine status: Declined, Education has been provided regarding the importance of this vaccine but patient still declined. Advised may receive this vaccine at local pharmacy or Health Dept.or vaccine clinic. Aware to provide a copy of the vaccination record if obtained from local pharmacy or Health Dept. Verbalized acceptance and understanding.  Qualifies for Shingles Vaccine? Yes   Zostavax completed Yes   Shingrix Completed?: Yes  Screening Tests Health Maintenance  Topic Date Due   COVID-19 Vaccine (3 - Pfizer risk series) 02/10/2020   INFLUENZA VACCINE  06/07/2021   COLONOSCOPY (Pts 45-32yrs Insurance coverage will need to be confirmed)  10/24/2024   TETANUS/TDAP  08/25/2026   Pneumonia Vaccine 56+ Years old  Completed   Hepatitis C Screening  Completed   Zoster Vaccines- Shingrix  Completed   HPV VACCINES  Aged Out    Health Maintenance  Health Maintenance Due  Topic Date Due   COVID-19 Vaccine (3 - Pfizer risk series) 02/10/2020   INFLUENZA VACCINE  06/07/2021    Colorectal cancer screening: Type of screening: Colonoscopy. Completed 10/25/19. Repeat every 5 years  Lung Cancer Screening: (Low Dose CT Chest recommended if Age 72-80 years, 30 pack-year currently smoking OR have quit w/in 15years.) does not qualify.     Additional Screening:  Hepatitis C Screening: does qualify; Completed 08/25/16  Vision Screening: Recommended annual ophthalmology exams for early detection of glaucoma and other disorders of the  eye. Is the patient up to date with their annual eye exam?  Yes  Who is the provider or what is the name of the office in which the patient attends annual eye exams? Dr. Edison Pace   Dental Screening: Recommended annual dental exams for proper oral hygiene  Community Resource Referral / Chronic Care Management: CRR required this visit?  No   CCM required this visit?  No      Plan:     I have personally reviewed and noted the following in the patient's chart:   Medical and social history Use of alcohol, tobacco or illicit drugs  Current medications and supplements including opioid prescriptions. Patient is not currently taking opioid prescriptions. Functional ability and status Nutritional status Physical activity Advanced directives List of other physicians Hospitalizations, surgeries, and ER visits in previous 12 months Vitals Screenings to include cognitive, depression, and falls Referrals and appointments  In addition, I have reviewed and discussed with patient certain preventive protocols, quality metrics, and best practice recommendations. A written personalized care plan for preventive services as well as general preventive health recommendations were provided to patient.   Due to this being a telephonic visit, the after visit summary with patients personalized plan was offered to patient via mail or my-chart. Patient would like to access on my-chart.   Loma Messing, LPN   [id number]   Nurse Health Advisor  Nurse Notes: none

## 2021-09-28 ENCOUNTER — Other Ambulatory Visit: Payer: Self-pay

## 2021-09-28 ENCOUNTER — Other Ambulatory Visit (INDEPENDENT_AMBULATORY_CARE_PROVIDER_SITE_OTHER): Payer: Medicare Other

## 2021-09-28 ENCOUNTER — Other Ambulatory Visit: Payer: Medicare Other

## 2021-09-28 ENCOUNTER — Ambulatory Visit (INDEPENDENT_AMBULATORY_CARE_PROVIDER_SITE_OTHER): Payer: Medicare Other

## 2021-09-28 VITALS — Ht 67.0 in | Wt 160.0 lb

## 2021-09-28 DIAGNOSIS — E7800 Pure hypercholesterolemia, unspecified: Secondary | ICD-10-CM | POA: Diagnosis not present

## 2021-09-28 DIAGNOSIS — Z0000 Encounter for general adult medical examination without abnormal findings: Secondary | ICD-10-CM

## 2021-09-28 DIAGNOSIS — R739 Hyperglycemia, unspecified: Secondary | ICD-10-CM | POA: Diagnosis not present

## 2021-09-28 DIAGNOSIS — Z125 Encounter for screening for malignant neoplasm of prostate: Secondary | ICD-10-CM | POA: Diagnosis not present

## 2021-09-28 LAB — COMPREHENSIVE METABOLIC PANEL
ALT: 14 U/L (ref 0–53)
AST: 19 U/L (ref 0–37)
Albumin: 4.3 g/dL (ref 3.5–5.2)
Alkaline Phosphatase: 74 U/L (ref 39–117)
BUN: 14 mg/dL (ref 6–23)
CO2: 30 mEq/L (ref 19–32)
Calcium: 9.4 mg/dL (ref 8.4–10.5)
Chloride: 102 mEq/L (ref 96–112)
Creatinine, Ser: 1.06 mg/dL (ref 0.40–1.50)
GFR: 70.14 mL/min (ref >60.00)
Glucose, Bld: 95 mg/dL (ref 70–99)
Potassium: 4.3 mEq/L (ref 3.5–5.1)
Sodium: 138 mEq/L (ref 135–145)
Total Bilirubin: 0.8 mg/dL (ref 0.2–1.2)
Total Protein: 7 g/dL (ref 6.0–8.3)

## 2021-09-28 LAB — LIPID PANEL
Cholesterol: 158 mg/dL (ref 0–200)
HDL: 42.4 mg/dL (ref >39.00)
LDL Cholesterol: 95 mg/dL (ref 0–99)
NonHDL: 115.68
Total CHOL/HDL Ratio: 4
Triglycerides: 102 mg/dL (ref 0.0–149.0)
VLDL: 20.4 mg/dL (ref 0.0–40.0)

## 2021-09-28 LAB — PSA: PSA: 1.81 ng/mL (ref 0.10–4.00)

## 2021-09-28 LAB — HEMOGLOBIN A1C: Hgb A1c MFr Bld: 5.7 % (ref 4.6–6.5)

## 2021-09-28 NOTE — Patient Instructions (Signed)
Mr. Bryan Barker , Thank you for taking time to complete for your Medicare Wellness Visit. I appreciate your ongoing commitment to your health goals. Please review the following plan we discussed and let me know if I can assist you in the future.   Screening recommendations/referrals: Colonoscopy: up to date, completed 10/25/19, due 10/24/24 Recommended yearly ophthalmology/optometry visit for glaucoma screening and checkup Recommended yearly dental visit for hygiene and checkup  Vaccinations: Influenza vaccine: Due-May obtain vaccine at our office or your local pharmacy. Pneumococcal vaccine: up to date Tdap vaccine: up to date, completed 08/25/16, due 08/25/26 Shingles vaccine: up to date   Covid-19:  newest  booster available at your local pharmacy if you change your mind.   Advanced directives: Please bring a copy of Living Will and/or Healthcare Power of Attorney for your chart.   Conditions/risks identified: see problem list  Next appointment: Follow up in one year for your annual wellness visit.   Preventive Care 72 Years and Older, Male Preventive care refers to lifestyle choices and visits with your health care provider that can promote health and wellness. What does preventive care include? A yearly physical exam. This is also called an annual well check. Dental exams once or twice a year. Routine eye exams. Ask your health care provider how often you should have your eyes checked. Personal lifestyle choices, including: Daily care of your teeth and gums. Regular physical activity. Eating a healthy diet. Avoiding tobacco and drug use. Limiting alcohol use. Practicing safe sex. Taking low doses of aspirin every day. Taking vitamin and mineral supplements as recommended by your health care provider. What happens during an annual well check? The services and screenings done by your health care provider during your annual well check will depend on your age, overall health, lifestyle  risk factors, and family history of disease. Counseling  Your health care provider may ask you questions about your: Alcohol use. Tobacco use. Drug use. Emotional well-being. Home and relationship well-being. Sexual activity. Eating habits. History of falls. Memory and ability to understand (cognition). Work and work Statistician. Screening  You may have the following tests or measurements: Height, weight, and BMI. Blood pressure. Lipid and cholesterol levels. These may be checked every 5 years, or more frequently if you are over 79 years old. Skin check. Lung cancer screening. You may have this screening every year starting at age 14 if you have a 30-pack-year history of smoking and currently smoke or have quit within the past 15 years. Fecal occult blood test (FOBT) of the stool. You may have this test every year starting at age 29. Flexible sigmoidoscopy or colonoscopy. You may have a sigmoidoscopy every 5 years or a colonoscopy every 10 years starting at age 99. Prostate cancer screening. Recommendations will vary depending on your family history and other risks. Hepatitis C blood test. Hepatitis B blood test. Sexually transmitted disease (STD) testing. Diabetes screening. This is done by checking your blood sugar (glucose) after you have not eaten for a while (fasting). You may have this done every 1-3 years. Abdominal aortic aneurysm (AAA) screening. You may need this if you are a current or former smoker. Osteoporosis. You may be screened starting at age 42 if you are at high risk. Talk with your health care provider about your test results, treatment options, and if necessary, the need for more tests. Vaccines  Your health care provider may recommend certain vaccines, such as: Influenza vaccine. This is recommended every year. Tetanus, diphtheria, and acellular pertussis (  Tdap, Td) vaccine. You may need a Td booster every 10 years. Zoster vaccine. You may need this after age  16. Pneumococcal 13-valent conjugate (PCV13) vaccine. One dose is recommended after age 65. Pneumococcal polysaccharide (PPSV23) vaccine. One dose is recommended after age 66. Talk to your health care provider about which screenings and vaccines you need and how often you need them. This information is not intended to replace advice given to you by your health care provider. Make sure you discuss any questions you have with your health care provider. Document Released: 11/20/2015 Document Revised: 07/13/2016 Document Reviewed: 08/25/2015 Elsevier Interactive Patient Education  2017 [employer] Prevention in the Home Falls can cause injuries. They can happen to people of all ages. There are many things you can do to make your home safe and to help prevent falls. What can I do on the outside of my home? Regularly fix the edges of walkways and driveways and fix any cracks. Remove anything that might make you trip as you walk through a door, such as a raised step or threshold. Trim any bushes or trees on the path to your home. Use bright outdoor lighting. Clear any walking paths of anything that might make someone trip, such as rocks or tools. Regularly check to see if handrails are loose or broken. Make sure that both sides of any steps have handrails. Any raised decks and porches should have guardrails on the edges. Have any leaves, snow, or ice cleared regularly. Use sand or salt on walking paths during winter. Clean up any spills in your garage right away. This includes oil or grease spills. What can I do in the bathroom? Use night lights. Install grab bars by the toilet and in the tub and shower. Do not use towel bars as grab bars. Use non-skid mats or decals in the tub or shower. If you need to sit down in the shower, use a plastic, non-slip stool. Keep the floor dry. Clean up any water that spills on the floor as soon as it happens. Remove soap buildup in the tub or shower  regularly. Attach bath mats securely with double-sided non-slip rug tape. Do not have throw rugs and other things on the floor that can make you trip. What can I do in the bedroom? Use night lights. Make sure that you have a light by your bed that is easy to reach. Do not use any sheets or blankets that are too big for your bed. They should not hang down onto the floor. Have a firm chair that has side arms. You can use this for support while you get dressed. Do not have throw rugs and other things on the floor that can make you trip. What can I do in the kitchen? Clean up any spills right away. Avoid walking on wet floors. Keep items that you use a lot in easy-to-reach places. If you need to reach something above you, use a strong step stool that has a grab bar. Keep electrical cords out of the way. Do not use floor polish or wax that makes floors slippery. If you must use wax, use non-skid floor wax. Do not have throw rugs and other things on the floor that can make you trip. What can I do with my stairs? Do not leave any items on the stairs. Make sure that there are handrails on both sides of the stairs and use them. Fix handrails that are broken or loose. Make sure that handrails are  as long as the stairways. Check any carpeting to make sure that it is firmly attached to the stairs. Fix any carpet that is loose or worn. Avoid having throw rugs at the top or bottom of the stairs. If you do have throw rugs, attach them to the floor with carpet tape. Make sure that you have a light switch at the top of the stairs and the bottom of the stairs. If you do not have them, ask someone to add them for you. What else can I do to help prevent falls? Wear shoes that: Do not have high heels. Have rubber bottoms. Are comfortable and fit you well. Are closed at the toe. Do not wear sandals. If you use a stepladder: Make sure that it is fully opened. Do not climb a closed stepladder. Make sure that  both sides of the stepladder are locked into place. Ask someone to hold it for you, if possible. Clearly mark and make sure that you can see: Any grab bars or handrails. First and last steps. Where the edge of each step is. Use tools that help you move around (mobility aids) if they are needed. These include: Canes. Walkers. Scooters. Crutches. Turn on the lights when you go into a dark area. Replace any light bulbs as soon as they burn out. Set up your furniture so you have a clear path. Avoid moving your furniture around. If any of your floors are uneven, fix them. If there are any pets around you, be aware of where they are. Review your medicines with your doctor. Some medicines can make you feel dizzy. This can increase your chance of falling. Ask your doctor what other things that you can do to help prevent falls. This information is not intended to replace advice given to you by your health care provider. Make sure you discuss any questions you have with your health care provider. Document Released: 08/20/2009 Document Revised: 03/31/2016 Document Reviewed: 11/28/2014 Elsevier Interactive Patient Education  2017 [employer].

## 2021-10-04 ENCOUNTER — Other Ambulatory Visit: Payer: Self-pay

## 2021-10-04 ENCOUNTER — Encounter: Payer: Self-pay | Admitting: Family Medicine

## 2021-10-04 ENCOUNTER — Ambulatory Visit (INDEPENDENT_AMBULATORY_CARE_PROVIDER_SITE_OTHER): Payer: Medicare Other | Admitting: Family Medicine

## 2021-10-04 VITALS — BP 134/70 | HR 68 | Temp 97.4°F | Ht 67.0 in | Wt 161.6 lb

## 2021-10-04 DIAGNOSIS — E7800 Pure hypercholesterolemia, unspecified: Secondary | ICD-10-CM

## 2021-10-04 DIAGNOSIS — Z23 Encounter for immunization: Secondary | ICD-10-CM

## 2021-10-04 DIAGNOSIS — Z7189 Other specified counseling: Secondary | ICD-10-CM | POA: Diagnosis not present

## 2021-10-04 DIAGNOSIS — Z0000 Encounter for general adult medical examination without abnormal findings: Secondary | ICD-10-CM | POA: Diagnosis not present

## 2021-10-04 DIAGNOSIS — K5900 Constipation, unspecified: Secondary | ICD-10-CM

## 2021-10-04 MED ORDER — ASPIRIN 81 MG PO TBEC: 81.0000 mg | DELAYED_RELEASE_TABLET | ORAL | Status: AC

## 2021-10-04 MED ORDER — PRAVASTATIN SODIUM 40 MG PO TABS: 40.0000 mg | ORAL_TABLET | Freq: Every day | ORAL | 4 refills | Status: DC

## 2021-10-04 NOTE — Assessment & Plan Note (Signed)
Advanced directives: completed this year. Oldest daughter Becky Randell is HCPOA. Advised to bring us a copy.  

## 2021-10-04 NOTE — Assessment & Plan Note (Signed)
Preventative protocols reviewed and updated unless pt declined. Discussed healthy diet and lifestyle.  

## 2021-10-04 NOTE — Patient Instructions (Addendum)
Flu shot today  Bring [place] copy of your living will at your convenience. You are doing well today Return as needed or in 1 year for next wellness visit/physical.   Health Maintenance After Age 72 After age 61, you are at a higher risk for certain long-term diseases and infections as well as injuries from falls. Falls are a major cause of broken bones and head injuries in people who are older than age 59. Getting regular preventive care can help to keep you healthy and well. Preventive care includes getting regular testing and making lifestyle changes as recommended by your health care provider. Talk with your health care provider about: Which screenings and tests you should have. A screening is a test that checks for a disease when you have no symptoms. A diet and exercise plan that is right for you. What should I know about screenings and tests to prevent falls? Screening and testing are the best ways to find a health problem early. Early diagnosis and treatment give you the best chance of managing medical conditions that are common after age 92. Certain conditions and lifestyle choices may make you more likely to have a fall. Your health care provider may recommend: Regular vision checks. Poor vision and conditions such as cataracts can make you more likely to have a fall. If you wear glasses, make sure to get your prescription updated if your vision changes. Medicine review. Work with your health care provider to regularly review all of the medicines you are taking, including over-the-counter medicines. Ask your health care provider about any side effects that may make you more likely to have a fall. Tell your health care provider if any medicines that you take make you feel dizzy or sleepy. Strength and balance checks. Your health care provider may recommend certain tests to check your strength and balance while standing, walking, or changing positions. Foot health exam. Foot pain and numbness, as  well as not wearing proper footwear, can make you more likely to have a fall. Screenings, including: Osteoporosis screening. Osteoporosis is a condition that causes the bones to get weaker and break more easily. Blood pressure screening. Blood pressure changes and medicines to control blood pressure can make you feel dizzy. Depression screening. You may be more likely to have a fall if you have a fear of falling, feel depressed, or feel unable to do activities that you used to do. Alcohol use screening. Using too much alcohol can affect your balance and may make you more likely to have a fall. Follow these instructions at home: Lifestyle Do not drink alcohol if: Your health care provider tells you not to drink. If you drink alcohol: Limit how much you have to: 0-1 drink a day for women. 0-2 drinks a day for men. Know how much alcohol is in your drink. In the [place] one drink equals one 12 oz bottle of beer (355 mL), one 5 oz glass of wine (148 mL), or one 1 oz glass of hard liquor (44 mL). Do not use any products that contain nicotine or tobacco. These products include cigarettes, chewing tobacco, and vaping devices, such as e-cigarettes. If you need help quitting, ask your health care provider. Activity  Follow a regular exercise program to stay fit. This will help you maintain your balance. Ask your health care provider what types of exercise are appropriate for you. If you need a cane or walker, use it as recommended by your health care provider. Wear supportive shoes that have  nonskid soles. Safety  Remove any tripping hazards, such as rugs, cords, and clutter. Install safety equipment such as grab bars in bathrooms and safety rails on stairs. Keep rooms and walkways well-lit. General instructions Talk with your health care provider about your risks for falling. Tell your health care provider if: You fall. Be sure to tell your health care provider about all falls, even ones that seem  minor. You feel dizzy, tiredness (fatigue), or off-balance. Take over-the-counter and prescription medicines only as told by your health care provider. These include supplements. Eat a healthy diet and maintain a healthy weight. A healthy diet includes low-fat dairy products, low-fat (lean) meats, and fiber from whole grains, beans, and lots of fruits and vegetables. Stay current with your vaccines. Schedule regular health, dental, and eye exams. Summary Having a healthy lifestyle and getting preventive care can help to protect your health and wellness after age 72. Screening and testing are the best way to find a health problem early and help you avoid having a fall. Early diagnosis and treatment give you the best chance for managing medical conditions that are more common for people who are older than age 20. Falls are a major cause of broken bones and head injuries in people who are older than age 79. Take precautions to prevent a fall at home. Work with your health care provider to learn what changes you can make to improve your health and wellness and to prevent falls. This information is not intended to replace advice given to you by your health care provider. Make sure you discuss any questions you have with your health care provider. Document Revised: 03/15/2021 Document Reviewed: 03/15/2021 Elsevier Patient Education  [place].

## 2021-10-04 NOTE — Assessment & Plan Note (Signed)
H/o this, well controlled on current regimen of colace and metamucil daily. No recent need for preparation-H cream.

## 2021-10-04 NOTE — Progress Notes (Signed)
Patient ID: Bryan Barker, male    DOB: Jun 26, 1951, 70 y.o.   MRN: [id number]  This visit was conducted in person.  BP 134/70   Pulse 68   Temp (!) 97.4 F (36.3 C) (Temporal)   Ht 5\' 7"  (1.702 m)   Wt 161 lb 9 oz (73.3 kg)   SpO2 98%   BMI 25.30 kg/m    CC: CPE Subjective:   HPI: Bryan Barker is a 70 y.o. male presenting on 10/04/2021 for Annual Exam (Prt 2. )   Saw health advisor last week for medicare wellness visit. Note reviewed.    No results found.  Flowsheet Row Clinical Support from 09/28/2021 in [place] at [facility]  PHQ-2 Total Score 0       Fall Risk  09/28/2021 09/29/2020 09/12/2019 09/05/2018 09/04/2017  Falls in the past year? 0 0 0 No No  Number falls in past yr: 0 - 0 - -  Injury with Fall? 0 - 0 - -  Risk for fall due to : No Fall Risks - - - -  Follow up Falls prevention discussed - Falls evaluation completed;Falls prevention discussed - -    Preventative: COLONOSCOPY Date: 10/2014 sig diverticulosis, rpt 5 yrs Bryan Barker) COLONOSCOPY 10/2019 - 2 TA, diverticulosis, rpt 5 yrs Bryan Barker) Prostate screening - PSA has decreased again. Gets yearly checks. No nocturia. Strong stream.  Lung cancer screening - not eligible  Flu shot yearly  [place] 12/2019,0 01/2020  Prevnar-13 08/2014, pneumovax 2016 Tetanus 08/2016 zostavax - 06/2012  shingrix - 09/2019, 02/2020 Advanced directives: completed this year. Oldest daughter Bryan Barker is HCPOA. Advised to bring [place] a copy.  Seat belt use discussed.  Sunscreen use discussed. No changing moles on skin.  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 Interim medical history since our last visit reviewed. Allergies and medications reviewed and updated. Outpatient Medications Prior to Visit  Medication Sig Dispense Refill   docusate sodium (COLACE) 100 MG capsule Take 100 mg by mouth daily.     Psyllium (METAMUCIL PO) Take by mouth daily.     aspirin 81 MG tablet Take 81 mg by mouth daily.     pravastatin (PRAVACHOL) 40 MG tablet Take 1 tablet (40 mg total) by mouth daily. 90 tablet 3   No facility-administered medications prior to visit.     Per HPI unless specifically indicated in ROS section below Review of Systems  Constitutional:  Negative for activity change, appetite change, chills, fatigue, fever and unexpected weight change.  HENT:  Negative for hearing loss.   Eyes:  Negative for visual disturbance.  Respiratory:  Negative for cough, chest tightness, shortness of breath and wheezing.   Cardiovascular:  Negative for chest pain, palpitations and leg swelling.  Gastrointestinal:  Negative for abdominal distention, abdominal pain, blood in stool, constipation, diarrhea, nausea and vomiting.  Genitourinary:  Negative for difficulty urinating and hematuria.  Musculoskeletal:  Negative for arthralgias, myalgias and neck pain.  Skin:  Negative for rash.  Neurological:  Negative for dizziness, seizures, syncope and headaches.  Hematological:  Negative for adenopathy. Does not bruise/bleed easily.  Psychiatric/Behavioral:  Negative for dysphoric mood. The patient is not nervous/anxious.    Objective:  BP 134/70   Pulse 68   Temp (!) 97.4 F (36.3 C) (Temporal)   Ht 5\' 7"  (1.702 m)   Wt 161 lb 9 oz (73.3 kg)   SpO2 98%   BMI 25.30 kg/m   Wt Readings from Last 3 Encounters:  10/04/21 161 lb 9 oz (73.3 kg)  09/28/21 160 lb (72.6 kg)  09/29/20  160 lb 6 oz (72.7 kg)      Physical Exam Vitals and nursing note reviewed.  Constitutional:      General: He is not in acute distress.    Appearance: Normal appearance. He is well-developed. He is not ill-appearing.  HENT:     Head: Normocephalic and atraumatic.     Right Ear: Hearing, tympanic membrane, ear canal and external ear normal.     Left Ear: Hearing, tympanic membrane, ear canal and external ear normal.  Eyes:     General: No scleral icterus.    Extraocular Movements: Extraocular movements intact.     Conjunctiva/sclera: Conjunctivae normal.     Pupils: Pupils are equal, round, and reactive to light.  Neck:     Thyroid: No thyroid mass or thyromegaly.     Vascular: No carotid bruit.  Cardiovascular:     Rate and Rhythm: Normal rate and regular rhythm.     Pulses: Normal pulses.          Radial pulses are 2+ on the right side and 2+ on the left side.     Heart sounds: Normal heart sounds. No murmur heard. Pulmonary:     Effort: Pulmonary effort is normal. No respiratory distress.     Breath sounds: Normal breath sounds. No wheezing, rhonchi or rales.  Abdominal:     General: Bowel sounds are normal. There is no distension.     Palpations: Abdomen is soft. There is no mass.     Tenderness: There is no abdominal tenderness. There is no guarding or rebound.     Hernia: No hernia is present.  Musculoskeletal:        General: Normal range of motion.     Cervical back: Normal range of motion and neck supple.     Right lower leg: No edema.     Left lower leg: No edema.  Lymphadenopathy:     Cervical: No cervical adenopathy.  Skin:    General: Skin is warm and dry.     Findings: No rash.  Neurological:     General: No focal deficit present.     Mental Status: He is alert and oriented to person, place, and time.  Psychiatric:        Mood and Affect: Mood normal.        Behavior: Behavior normal.        Thought Content: Thought content normal.        Judgment:  Judgment normal.      Results for orders placed or performed in visit on 09/28/21  Hemoglobin A1c  Result Value Ref Range   Hgb A1c MFr Bld 5.7 4.6 - 6.5 %  PSA  Result Value Ref Range   PSA 1.81 0.10 - 4.00 ng/mL  Comprehensive metabolic panel  Result Value Ref Range   Sodium 138 135 - 145 mEq/L  Potassium 4.3 3.5 - 5.1 mEq/L   Chloride 102 96 - 112 mEq/L   CO2 30 19 - 32 mEq/L   Glucose, Bld 95 70 - 99 mg/dL   BUN 14 6 - 23 mg/dL   Creatinine, Ser 1.06 0.40 - 1.50 mg/dL   Total Bilirubin 0.8 0.2 - 1.2 mg/dL   Alkaline Phosphatase 74 39 - 117 U/L   AST 19 0 - 37 U/L   ALT 14 0 - 53 U/L   Total Protein 7.0 6.0 - 8.3 g/dL   Albumin 4.3 3.5 - 5.2 g/dL   GFR 70.14 >60.00 mL/min   Calcium 9.4 8.4 - 10.5 mg/dL  Lipid panel  Result Value Ref Range   Cholesterol 158 0 - 200 mg/dL   Triglycerides 102.0 0.0 - 149.0 mg/dL   HDL 42.40 >39.00 mg/dL   VLDL 20.4 0.0 - 40.0 mg/dL   LDL Cholesterol 95 0 - 99 mg/dL   Total CHOL/HDL Ratio 4    NonHDL 115.68     Assessment & Plan:  This visit occurred during the SARS-CoV-2 public health emergency.  Safety protocols were in place, including screening questions prior to the visit, additional usage of staff PPE, and extensive cleaning of exam room while observing appropriate contact time as indicated for disinfecting solutions.   Problem List Items Addressed This Visit     Healthcare maintenance - Primary (Chronic)    Preventative protocols reviewed and updated unless pt declined. Discussed healthy diet and lifestyle.       Advanced care planning/counseling discussion (Chronic)    Advanced directives: completed this year. Oldest daughter Bryan Barker is HCPOA. Advised to bring [place] a copy.       Hypercholesteremia    Chronic, stable on pravastatin. On aspirin for primary prevention - suggested drop dose to MWF. The 10-year ASCVD risk score (Arnett DK, et al., 2019) is: 21.4%   Values used to calculate the score:     Age: 40 years      Sex: Male     Is Non-Hispanic African American: No     Diabetic: No     Tobacco smoker: No     Systolic Blood Pressure: 536 mmHg     Is BP treated: No     HDL Cholesterol: 42.4 mg/dL     Total Cholesterol: 158 mg/dL       Relevant Medications   pravastatin (PRAVACHOL) 40 MG tablet   aspirin 81 MG EC tablet   Constipation    H/o this, well controlled on current regimen of colace and metamucil daily. No recent need for preparation-H cream.       Other Visit Diagnoses     Need for influenza vaccination       Relevant Orders   Flu Vaccine QUAD High Dose(Fluad) (Completed)        Meds ordered this encounter  Medications   pravastatin (PRAVACHOL) 40 MG tablet    Sig: Take 1 tablet (40 mg total) by mouth daily.    Dispense:  90 tablet    Refill:  4   aspirin 81 MG EC tablet    Sig: Take 1 tablet (81 mg total) by mouth every Monday, Wednesday, and Friday. Swallow whole.   Orders Placed This Encounter  Procedures   Flu Vaccine QUAD High Dose(Fluad)     Patient instructions: Flu shot today  Bring [place] copy of your living will at your convenience. You are doing well today Return as needed or in 1 year for next  wellness visit/physical.   Follow up plan: Return in about 1 year (around 10/04/2022) for annual exam, prior fasting for blood work.  Ria Bush, MD

## 2021-10-04 NOTE — Assessment & Plan Note (Signed)
Chronic, stable on pravastatin. On aspirin for primary prevention - suggested drop dose to MWF. The 10-year ASCVD risk score (Arnett DK, et al., 2019) is: 21.4%   Values used to calculate the score:     Age: 69 years     Sex: Male     Is Non-Hispanic African American: No     Diabetic: No     Tobacco smoker: No     Systolic Blood Pressure: 188 mmHg     Is BP treated: No     HDL Cholesterol: 42.4 mg/dL     Total Cholesterol: 158 mg/dL

## 2021-12-17 ENCOUNTER — Ambulatory Visit (INDEPENDENT_AMBULATORY_CARE_PROVIDER_SITE_OTHER): Payer: Medicare Other | Admitting: Podiatry

## 2021-12-17 ENCOUNTER — Encounter: Payer: Self-pay | Admitting: Podiatry

## 2021-12-17 ENCOUNTER — Other Ambulatory Visit: Payer: Self-pay

## 2021-12-17 DIAGNOSIS — M79675 Pain in left toe(s): Secondary | ICD-10-CM

## 2021-12-17 DIAGNOSIS — B351 Tinea unguium: Secondary | ICD-10-CM

## 2021-12-17 DIAGNOSIS — M79674 Pain in right toe(s): Secondary | ICD-10-CM | POA: Diagnosis not present

## 2021-12-17 NOTE — Progress Notes (Signed)
° °  SUBJECTIVE Patient presents to office today complaining of elongated, thickened nails that cause pain while ambulating in shoes.  He is unable to trim his own nails. Patient is here for further evaluation and treatment.  Past Medical History:  Diagnosis Date   Adenomatous colon polyp 2012   Cataract 2019   bilateral eyes   Dehydration 03/2011   ER for syncope attributed to dehydration   Diverticulosis    by colonoscopy   Hemorrhoids    Hyperlipidemia     OBJECTIVE General Patient is awake, alert, and oriented x 3 and in no acute distress. Derm Skin is dry and supple bilateral. Negative open lesions or macerations. Remaining integument unremarkable. Nails are tender, long, thickened and dystrophic with subungual debris, consistent with onychomycosis, 1-5 bilateral. No signs of infection noted. Vasc  DP and PT pedal pulses palpable bilaterally. Temperature gradient within normal limits.  Neuro Epicritic and protective threshold sensation grossly intact bilaterally.  Musculoskeletal Exam No symptomatic pedal deformities noted bilateral. Muscular strength within normal limits.  ASSESSMENT 1. Onychodystrophic nails 1-5 bilateral with hyperkeratosis of nails.  2. Onychomycosis of nail due to dermatophyte bilateral 3. Pain in foot bilateral  PLAN OF CARE 1. Patient evaluated today.  2. Instructed to maintain good pedal hygiene and foot care.  3. Mechanical debridement of nails 1-5 bilaterally performed using a nail nipper. Filed with dremel without incident.  4. Return to clinic in 3 mos.   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    Edrick Kins, DPM [facility]  Dr. Edrick Kins, DPM    [address], [place] [address]                Office [phone]  Fax [phone]

## 2022-03-18 ENCOUNTER — Ambulatory Visit (INDEPENDENT_AMBULATORY_CARE_PROVIDER_SITE_OTHER): Payer: Medicare Other | Admitting: Podiatry

## 2022-03-18 DIAGNOSIS — M79675 Pain in left toe(s): Secondary | ICD-10-CM | POA: Diagnosis not present

## 2022-03-18 DIAGNOSIS — B351 Tinea unguium: Secondary | ICD-10-CM | POA: Diagnosis not present

## 2022-03-18 DIAGNOSIS — M79674 Pain in right toe(s): Secondary | ICD-10-CM

## 2022-03-18 NOTE — Progress Notes (Signed)
? ?  SUBJECTIVE ?Patient presents to office today complaining of elongated, thickened nails that cause pain while ambulating in shoes.  He is unable to trim his own nails. Patient is here for further evaluation and treatment. ? ?Past Medical History:  ?Diagnosis Date  ? Adenomatous colon polyp 2012  ? Cataract 2019  ? bilateral eyes  ? Dehydration 03/2011  ? ER for syncope attributed to dehydration  ? Diverticulosis   ? by colonoscopy  ? Hemorrhoids   ? Hyperlipidemia   ? ? ?OBJECTIVE ?General Patient is awake, alert, and oriented x 3 and in no acute distress. ?Derm Skin is dry and supple bilateral. Negative open lesions or macerations. Remaining integument unremarkable. Nails are tender, long, thickened and dystrophic with subungual debris, consistent with onychomycosis, 1-5 bilateral. No signs of infection noted. ?Vasc  DP and PT pedal pulses palpable bilaterally. Temperature gradient within normal limits.  ?Neuro Epicritic and protective threshold sensation grossly intact bilaterally.  ?Musculoskeletal Exam No symptomatic pedal deformities noted bilateral. Muscular strength within normal limits. ? ?ASSESSMENT ?1. Onychodystrophic nails 1-5 bilateral with hyperkeratosis of nails.  ?2. Onychomycosis of nail due to dermatophyte bilateral ?3. Pain in foot bilateral ? ?PLAN OF CARE ?1. Patient evaluated today.  ?2. Instructed to maintain good pedal hygiene and foot care.  ?3. Mechanical debridement of nails 1-5 bilaterally performed using a nail nipper. Filed with dremel without incident.  ?4. Return to clinic in 3 mos.  ? 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  ? ? ?Edrick Kins, DPM ?[place] ? ?Dr. Edrick Kins, DPM  ?  ?[address]                                   ?[place], [place] [address]                ?Office [phone]  ?Fax [phone] ? ? ? ? ?

## 2022-06-17 ENCOUNTER — Ambulatory Visit (INDEPENDENT_AMBULATORY_CARE_PROVIDER_SITE_OTHER): Payer: Medicare Other | Admitting: Podiatry

## 2022-06-17 DIAGNOSIS — M79675 Pain in left toe(s): Secondary | ICD-10-CM

## 2022-06-17 DIAGNOSIS — M79674 Pain in right toe(s): Secondary | ICD-10-CM | POA: Diagnosis not present

## 2022-06-17 DIAGNOSIS — B351 Tinea unguium: Secondary | ICD-10-CM | POA: Diagnosis not present

## 2022-06-17 NOTE — Progress Notes (Signed)
   Chief Complaint  Patient presents with   foot care    Routine foot care    SUBJECTIVE Patient presents to office today complaining of elongated, thickened nails that cause pain while ambulating in shoes.  He is unable to trim his own nails. Patient is here for further evaluation and treatment.  Past Medical History:  Diagnosis Date   Adenomatous colon polyp 2012   Cataract 2019   bilateral eyes   Dehydration 03/2011   ER for syncope attributed to dehydration   Diverticulosis    by colonoscopy   Hemorrhoids    Hyperlipidemia     OBJECTIVE General Patient is awake, alert, and oriented x 3 and in no acute distress. Derm Skin is dry and supple bilateral. Negative open lesions or macerations. Remaining integument unremarkable. Nails are tender, long, thickened and dystrophic with subungual debris, consistent with onychomycosis, 1-5 bilateral. No signs of infection noted. Vasc  DP and PT pedal pulses palpable bilaterally. Temperature gradient within normal limits.  Neuro Epicritic and protective threshold sensation grossly intact bilaterally.  Musculoskeletal Exam No symptomatic pedal deformities noted bilateral. Muscular strength within normal limits.  ASSESSMENT 1. Onychodystrophic nails 1-5 bilateral with hyperkeratosis of nails.  2. Onychomycosis of nail due to dermatophyte bilateral 3. Pain in foot bilateral  PLAN OF CARE 1. Patient evaluated today.  2. Instructed to maintain good pedal hygiene and foot care.  3. Mechanical debridement of nails 1-5 bilaterally performed using a nail nipper. Filed with dremel without incident.  4. Return to clinic in 3 mos.   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    Edrick Kins, DPM [facility]  Dr. Edrick Kins, DPM    [address], [place] [address]                Office [phone]  Fax [phone]

## 2022-09-16 ENCOUNTER — Ambulatory Visit (INDEPENDENT_AMBULATORY_CARE_PROVIDER_SITE_OTHER): Payer: Medicare Other | Admitting: Podiatry

## 2022-09-16 ENCOUNTER — Encounter: Payer: Self-pay | Admitting: Podiatry

## 2022-09-16 DIAGNOSIS — B351 Tinea unguium: Secondary | ICD-10-CM

## 2022-09-16 DIAGNOSIS — M79675 Pain in left toe(s): Secondary | ICD-10-CM

## 2022-09-16 DIAGNOSIS — M79674 Pain in right toe(s): Secondary | ICD-10-CM | POA: Diagnosis not present

## 2022-09-16 NOTE — Progress Notes (Signed)
   Chief Complaint  Patient presents with   Nail Problem    "Toe trim"    SUBJECTIVE Patient presents to office today complaining of elongated, thickened nails that cause pain while ambulating in shoes.  He is unable to trim his own nails. Patient is here for further evaluation and treatment.  Past Medical History:  Diagnosis Date   Adenomatous colon polyp 2012   Cataract 2019   bilateral eyes   Dehydration 03/2011   ER for syncope attributed to dehydration   Diverticulosis    by colonoscopy   Hemorrhoids    Hyperlipidemia     OBJECTIVE General Patient is awake, alert, and oriented x 3 and in no acute distress. Derm Skin is dry and supple bilateral. Negative open lesions or macerations. Remaining integument unremarkable. Nails are tender, long, thickened and dystrophic with subungual debris, consistent with onychomycosis, 1-5 bilateral. No signs of infection noted. Vasc  DP and PT pedal pulses palpable bilaterally. Temperature gradient within normal limits.  Neuro Epicritic and protective threshold sensation grossly intact bilaterally.  Musculoskeletal Exam No symptomatic pedal deformities noted bilateral. Muscular strength within normal limits.  ASSESSMENT 1. Onychodystrophic nails 1-5 bilateral with hyperkeratosis of nails.  2. Onychomycosis of nail due to dermatophyte bilateral 3. Pain in foot bilateral  PLAN OF CARE 1. Patient evaluated today.  2. Instructed to maintain good pedal hygiene and foot care.  3. Mechanical debridement of nails 1-5 bilaterally performed using a nail nipper. Filed with dremel without incident.  4. Return to clinic in 3 mos.   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    Edrick Kins, DPM [facility]  Dr. Edrick Kins, DPM    [address], [place] [address]                Office [phone]  Fax [phone]

## 2022-09-20 ENCOUNTER — Telehealth: Payer: Self-pay | Admitting: Family Medicine

## 2022-09-20 NOTE — Telephone Encounter (Signed)
LVM informing patient that I had to move his time for 10:30 am to 12 pm on 10/03/22 with nha

## 2022-09-25 ENCOUNTER — Other Ambulatory Visit: Payer: Self-pay | Admitting: Family Medicine

## 2022-09-25 DIAGNOSIS — R739 Hyperglycemia, unspecified: Secondary | ICD-10-CM

## 2022-09-25 DIAGNOSIS — Z125 Encounter for screening for malignant neoplasm of prostate: Secondary | ICD-10-CM

## 2022-09-25 DIAGNOSIS — E7800 Pure hypercholesterolemia, unspecified: Secondary | ICD-10-CM

## 2022-09-28 ENCOUNTER — Other Ambulatory Visit (INDEPENDENT_AMBULATORY_CARE_PROVIDER_SITE_OTHER): Payer: Medicare Other

## 2022-09-28 DIAGNOSIS — E7800 Pure hypercholesterolemia, unspecified: Secondary | ICD-10-CM

## 2022-09-28 DIAGNOSIS — R739 Hyperglycemia, unspecified: Secondary | ICD-10-CM | POA: Diagnosis not present

## 2022-09-28 DIAGNOSIS — Z125 Encounter for screening for malignant neoplasm of prostate: Secondary | ICD-10-CM

## 2022-09-28 LAB — COMPREHENSIVE METABOLIC PANEL
ALT: 11 U/L (ref 0–53)
AST: 16 U/L (ref 0–37)
Albumin: 4.2 g/dL (ref 3.5–5.2)
Alkaline Phosphatase: 77 U/L (ref 39–117)
BUN: 12 mg/dL (ref 6–23)
CO2: 32 mEq/L (ref 19–32)
Calcium: 9.4 mg/dL (ref 8.4–10.5)
Chloride: 102 mEq/L (ref 96–112)
Creatinine, Ser: 1.03 mg/dL (ref 0.40–1.50)
GFR: 72.09 mL/min (ref >60.00)
Glucose, Bld: 101 mg/dL — ABNORMAL HIGH (ref 70–99)
Potassium: 4.9 mEq/L (ref 3.5–5.1)
Sodium: 140 mEq/L (ref 135–145)
Total Bilirubin: 0.6 mg/dL (ref 0.2–1.2)
Total Protein: 6.9 g/dL (ref 6.0–8.3)

## 2022-09-28 LAB — TSH: TSH: 2.22 u[IU]/mL (ref 0.35–5.50)

## 2022-09-28 LAB — PSA, MEDICARE: PSA: 2.39 ng/ml (ref 0.10–4.00)

## 2022-09-28 LAB — LIPID PANEL
Cholesterol: 146 mg/dL (ref 0–200)
HDL: 41.2 mg/dL (ref >39.00)
LDL Cholesterol: 75 mg/dL (ref 0–99)
NonHDL: 104.49
Total CHOL/HDL Ratio: 4
Triglycerides: 149 mg/dL (ref 0.0–149.0)
VLDL: 29.8 mg/dL (ref 0.0–40.0)

## 2022-09-28 LAB — HEMOGLOBIN A1C: Hgb A1c MFr Bld: 5.8 % (ref 4.6–6.5)

## 2022-09-30 ENCOUNTER — Ambulatory Visit: Payer: Medicare Other

## 2022-10-03 ENCOUNTER — Ambulatory Visit (INDEPENDENT_AMBULATORY_CARE_PROVIDER_SITE_OTHER): Payer: Medicare Other | Admitting: *Deleted

## 2022-10-03 DIAGNOSIS — Z0000 Encounter for general adult medical examination without abnormal findings: Secondary | ICD-10-CM | POA: Diagnosis not present

## 2022-10-03 NOTE — Patient Instructions (Signed)
Mr. Bryan Barker , Thank you for taking time to come for your Medicare Wellness Visit. I appreciate your ongoing commitment to your health goals. Please review the following plan we discussed and let me know if I can assist you in the future.   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      Patient Stated     09/12/2019, I will maintain and continue medications as prescribed.      Patient Stated     Would like to maintain weight.      Patient Stated     Would like to get out of pre-diabetic stage Control sugar        This is a list of the screening recommended for you and due dates:  Health Maintenance  Topic Date Due   Flu Shot  06/07/2022   COVID-19 Vaccine (3 - Pfizer risk series) 10/19/2022*   Medicare Annual Wellness Visit  10/04/2023   Colon Cancer Screening  10/24/2024   Pneumonia Vaccine  Completed   Hepatitis C Screening: USPSTF Recommendation to screen - Ages 18-79 yo.  Completed   Zoster (Shingles) Vaccine  Completed   HPV Vaccine  Aged Out  *Topic was postponed. The date shown is not the original due date.    Advanced directives: on file  Conditions/risks identified:   Next appointment: Follow up in one year for your annual wellness visit. 10-05-2022 @ 8:30   [place] 65 Years and Older, Male  Preventive care refers to lifestyle choices and visits with your health care provider that can promote health and wellness. What does preventive care include? A yearly physical exam. This is also called an annual well check. Dental exams once or twice a year. Routine eye exams. Ask your health care provider how often you should have your eyes checked. Personal lifestyle choices, including: Daily care of your teeth and gums. Regular physical activity. Eating a healthy diet. Avoiding tobacco and drug use. Limiting alcohol  use. Practicing safe sex. Taking low doses of aspirin every day. Taking vitamin and mineral supplements as recommended by your health care provider. What happens during an annual well check? The services and screenings done by your health care provider during your annual well check will depend on your age, overall health, lifestyle risk factors, and family history of disease. Counseling  Your health care provider may ask you questions about your: Alcohol use. Tobacco use. Drug use. Emotional well-being. Home and relationship well-being. Sexual activity. Eating habits. History of falls. Memory and ability to understand (cognition). Work and work Statistician. Screening  You may have the following tests or measurements: Height, weight, and BMI. Blood pressure. Lipid and cholesterol levels. These may be checked every 5 years, or more frequently if you are over 53 years old. Skin check. Lung cancer screening. You may have this screening every year starting at age 35 if you have a 30-pack-year history of smoking and currently smoke or have quit within the past 15 years. Fecal occult blood test (FOBT) of the stool. You may have this test every year starting at age 6. Flexible sigmoidoscopy or colonoscopy. You may have a sigmoidoscopy every 5 years or a colonoscopy every 10 years starting at age 104. Prostate cancer screening. Recommendations will vary depending on your family history and other risks. Hepatitis C  blood test. Hepatitis B blood test. Sexually transmitted disease (STD) testing. Diabetes screening. This is done by checking your blood sugar (glucose) after you have not eaten for a while (fasting). You may have this done every 1-3 years. Abdominal aortic aneurysm (AAA) screening. You may need this if you are a current or former smoker. Osteoporosis. You may be screened starting at age 69 if you are at high risk. Talk with your health care provider about your test results,  treatment options, and if necessary, the need for more tests. Vaccines  Your health care provider may recommend certain vaccines, such as: Influenza vaccine. This is recommended every year. Tetanus, diphtheria, and acellular pertussis (Tdap, Td) vaccine. You may need a Td booster every 10 years. Zoster vaccine. You may need this after age 35. Pneumococcal 13-valent conjugate (PCV13) vaccine. One dose is recommended after age 41. Pneumococcal polysaccharide (PPSV23) vaccine. One dose is recommended after age 22. Talk to your health care provider about which screenings and vaccines you need and how often you need them. This information is not intended to replace advice given to you by your health care provider. Make sure you discuss any questions you have with your health care provider. Document Released: 11/20/2015 Document Revised: 07/13/2016 Document Reviewed: 08/25/2015 Elsevier Interactive Patient Education  2017 [place] Prevention in the Home Falls can cause injuries. They can happen to people of all ages. There are many things you can do to make your home safe and to help prevent falls. What can I do on the outside of my home? Regularly fix the edges of walkways and driveways and fix any cracks. Remove anything that might make you trip as you walk through a door, such as a raised step or threshold. Trim any bushes or trees on the path to your home. Use bright outdoor lighting. Clear any walking paths of anything that might make someone trip, such as rocks or tools. Regularly check to see if handrails are loose or broken. Make sure that both sides of any steps have handrails. Any raised decks and porches should have guardrails on the edges. Have any leaves, snow, or ice cleared regularly. Use sand or salt on walking paths during winter. Clean up any spills in your garage right away. This includes oil or grease spills. What can I do in the bathroom? Use night  lights. Install grab bars by the toilet and in the tub and shower. Do not use towel bars as grab bars. Use non-skid mats or decals in the tub or shower. If you need to sit down in the shower, use a plastic, non-slip stool. Keep the floor dry. Clean up any water that spills on the floor as soon as it happens. Remove soap buildup in the tub or shower regularly. Attach bath mats securely with double-sided non-slip rug tape. Do not have throw rugs and other things on the floor that can make you trip. What can I do in the bedroom? Use night lights. Make sure that you have a light by your bed that is easy to reach. Do not use any sheets or blankets that are too big for your bed. They should not hang down onto the floor. Have a firm chair that has side arms. You can use this for support while you get dressed. Do not have throw rugs and other things on the floor that can make you trip. What can I do in the kitchen? Clean up any spills right away. Avoid walking on  wet floors. Keep items that you use a lot in easy-to-reach places. If you need to reach something above you, use a strong step stool that has a grab bar. Keep electrical cords out of the way. Do not use floor polish or wax that makes floors slippery. If you must use wax, use non-skid floor wax. Do not have throw rugs and other things on the floor that can make you trip. What can I do with my stairs? Do not leave any items on the stairs. Make sure that there are handrails on both sides of the stairs and use them. Fix handrails that are broken or loose. Make sure that handrails are as long as the stairways. Check any carpeting to make sure that it is firmly attached to the stairs. Fix any carpet that is loose or worn. Avoid having throw rugs at the top or bottom of the stairs. If you do have throw rugs, attach them to the floor with carpet tape. Make sure that you have a light switch at the top of the stairs and the bottom of the stairs. If  you do not have them, ask someone to add them for you. What else can I do to help prevent falls? Wear shoes that: Do not have high heels. Have rubber bottoms. Are comfortable and fit you well. Are closed at the toe. Do not wear sandals. If you use a stepladder: Make sure that it is fully opened. Do not climb a closed stepladder. Make sure that both sides of the stepladder are locked into place. Ask someone to hold it for you, if possible. Clearly mark and make sure that you can see: Any grab bars or handrails. First and last steps. Where the edge of each step is. Use tools that help you move around (mobility aids) if they are needed. These include: Canes. Walkers. Scooters. Crutches. Turn on the lights when you go into a dark area. Replace any light bulbs as soon as they burn out. Set up your furniture so you have a clear path. Avoid moving your furniture around. If any of your floors are uneven, fix them. If there are any pets around you, be aware of where they are. Review your medicines with your doctor. Some medicines can make you feel dizzy. This can increase your chance of falling. Ask your doctor what other things that you can do to help prevent falls. This information is not intended to replace advice given to you by your health care provider. Make sure you discuss any questions you have with your health care provider. Document Released: 08/20/2009 Document Revised: 03/31/2016 Document Reviewed: 11/28/2014 Elsevier Interactive Patient Education  2017 [employer].

## 2022-10-03 NOTE — Progress Notes (Signed)
Subjective:   Bryan BAUMLER is a 71 y.o. male who presents for Medicare Annual/Subsequent preventive examination.  I connected with  Dionne Bucy on 10/03/22 by a telephone enabled telemedicine application and verified that I am speaking with the correct person using two identifiers.   I discussed the limitations of evaluation and management by telemedicine. The patient expressed understanding and agreed to proceed.  Patient location: home  Provider location:  Tele-health-home   Review of Systems     Cardiac Risk Factors include: advanced age (>40mn, >>33women);male gender     Objective:    Today's Vitals   There is no height or weight on file to calculate BMI.     10/03/2022   12:03 PM 09/28/2021    1:16 PM 10/25/2019    7:19 AM 09/12/2019   10:32 AM 09/05/2018    8:34 AM 09/04/2017    8:15 AM 08/25/2016   11:05 AM  Advanced Directives  Does Patient Have a Medical Advance Directive? Yes Yes No No No No No  Type of [place] will       Does patient want to make changes to medical advance directive?  Yes (MAU/Ambulatory/Procedural Areas - Information given)       Copy of [place] Chart? Yes - validated most recent copy scanned in chart (See row information)        Would patient like information on creating a medical advance directive?   No - Patient declined Yes (MAU/Ambulatory/Procedural Areas - Information given) Yes (MAU/Ambulatory/Procedural Areas - Information given) No - Patient declined Yes - Educational materials given    Current Medications (verified) Outpatient Encounter Medications as of 10/03/2022  Medication Sig   aspirin 81 MG EC tablet Take 1 tablet (81 mg total) by mouth every Monday, Wednesday, and Friday. Swallow whole.   docusate sodium (COLACE) 100 MG capsule Take 100 mg by mouth daily.   pravastatin (PRAVACHOL) 40 MG tablet Take 1 tablet (40 mg total)  by mouth daily.   Psyllium (METAMUCIL PO) Take by mouth daily.   No facility-administered encounter medications on file as of 10/03/2022.    Allergies (verified) Patient has no known allergies.   History: Past Medical History:  Diagnosis Date   Adenomatous colon polyp 2012   Cataract 2019   bilateral eyes   Dehydration 03/2011   ER for syncope attributed to dehydration   Diverticulosis    by colonoscopy   Hemorrhoids    Hyperlipidemia    Past Surgical History:  Procedure Laterality Date   COLONOSCOPY  06/30/11   2012: 4 polyps (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), diverticulosis   COLONOSCOPY  10/2014   sig diverticulosis, rpt 5 yrs (Carlean Purl   COLONOSCOPY  10/2019   2 TA, diverticulosis, rpt 5 yrs (Carlean Purl   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 ([facility])    Family History  Problem Relation Age of Onset   Diabetes Mother    Hypertension Mother    Heart disease Mother        Pacer   Colon polyps Mother    Coronary artery disease Father 63      MI   Cancer Brother        esoph, mets to brain   Diabetes Brother    Alcohol abuse Sister    Colon cancer Neg Hx    Stroke Neg Hx  Esophageal cancer Neg Hx    Stomach cancer Neg Hx    Rectal cancer Neg Hx    Social History   Socioeconomic History   Marital status: Married    Spouse name: Not on file   Number of children: 2   Years of education: Not on file   Highest education level: Not on file  Occupational History   Occupation: retired    Comment: retiring 2/11  Tobacco Use   Smoking status: Former    Packs/day: 1.00    Years: 3.00    Total pack years: 3.00    Types: Cigarettes    Quit date: 06/16/1968    Years since quitting: 54.3   Smokeless tobacco: Never   Tobacco comments:    only smoked as a teenager  Vaping Use   Vaping Use: Never used  Substance and Sexual Activity   Alcohol use: No    Alcohol/week: 0.0 standard drinks of alcohol   Drug use: No    Sexual activity: Yes  Other Topics Concern   Not on file  Social History Narrative   Caffeine: 1 cup/day   Married and lives with wife, 1 dog, cows   2 daughters   Occupation: retired, was Presenter, 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, 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   Diet: good water, fruits/vegetables daily   Social Determinants of Health   Financial Resource Strain: [place]  (10/03/2022)   Overall Financial Resource Strain (CARDIA)    Difficulty of Paying Living Expenses: Not hard at all  Food Insecurity: No [place] (10/03/2022)   Hunger Vital Sign    Worried About Running Out of Food in the Last Year: Never true    [place] in the Last Year: Never true  Transportation Needs: No Transportation Needs (10/03/2022)   PRAPARE - Hydrologist (Medical): No    Lack of Transportation (Non-Medical): No  Physical Activity: Sufficiently Active (09/28/2021)   Exercise Vital Sign    Days of Exercise per Week: 7 days    Minutes of Exercise per Session: 60 min  Stress: No Stress Concern Present (10/03/2022)   [place]    Feeling of Stress : Not at all  Social Connections: [place] (10/03/2022)   Social Connection and Isolation Panel [NHANES]    Frequency of Communication with Friends and Family: More than three times a week    Frequency of Social Gatherings with Friends and Family: Three times a week    Attends Religious Services: More than 4 times per year    Active Member of Clubs or Organizations: Yes    Attends Music therapist: More than 4 times per year    Marital Status: Married    Tobacco Counseling Counseling given: Not Answered Tobacco comments: only smoked as a teenager   Clinical Intake:  Pre-visit preparation completed: Yes  Pain : No/denies pain     Diabetes: No  How often do you need to have someone help you when you read  instructions, pamphlets, or other written materials from your doctor or pharmacy?: 1 - Never  Diabetic?  no  Interpreter Needed?: No  Information entered by :: Leroy Kennedy LPN   Activities of Daily Living    10/03/2022   12:08 PM  In your present state of health, do you have any difficulty performing the following activities:  Hearing? 1  Vision? 0  Difficulty concentrating or making  decisions? 0  Walking or climbing stairs? 0  Dressing or bathing? 0  Doing errands, shopping? 0  Preparing Food and eating ? N  Using the Toilet? N  In the past six months, have you accidently leaked urine? N  Do you have problems with loss of bowel control? N  Managing your Medications? N  Managing your Finances? N  Housekeeping or managing your Housekeeping? N    Patient Care Team: Ria Bush, MD as PCP - General (Family Medicine) Eula Listen, DDS as Referring Physician (Dentistry) Gatha Mayer, MD as Consulting Physician (Gastroenterology)  Indicate any recent Medical Services you may have received from other than [facility] providers in the past year (date may be approximate).     Assessment:   This is a routine wellness examination for [place].  Hearing/Vision screen Hearing Screening - Comments:: Some trouble with people speaking low Does not wear hearing aids Vision Screening - Comments:: Up to date Edison Pace  Dietary issues and exercise activities discussed: Current Exercise Habits: The patient does not participate in regular exercise at present   Goals Addressed             This Visit's Progress    Patient Stated       Would like to get out of pre-diabetic stage Control sugar       Depression Screen    10/03/2022   12:09 PM 09/28/2021    1:20 PM 09/29/2020    8:21 AM 09/12/2019   10:33 AM 09/05/2018    8:33 AM 09/04/2017    8:14 AM 08/25/2016   11:07 AM  PHQ 2/9 Scores  PHQ - 2 Score 0 0 0 0 0 0 0  PHQ- 9 Score 0   0 0 0     Fall Risk    10/03/2022    12:03 PM 09/28/2021    1:18 PM 09/29/2020    8:21 AM 09/12/2019   10:33 AM 09/05/2018    8:33 AM  [place] in the past year? 0 0 0 0 No  Number falls in past yr: 0 0  0   Injury with Fall? 0 0  0   Risk for fall due to :  No Fall Risks     Follow up Falls evaluation completed;Education provided;Falls prevention discussed Falls prevention discussed  Falls evaluation completed;Falls prevention discussed     FALL RISK PREVENTION PERTAINING TO THE HOME:  Any stairs in or around the home? No  If so, are there any without handrails? No  Home free of loose throw rugs in walkways, pet beds, electrical cords, etc? Yes  Adequate lighting in your home to reduce risk of falls? Yes   ASSISTIVE DEVICES UTILIZED TO PREVENT FALLS:  Life alert? No  Use of a cane, walker or w/c? No  Grab bars in the bathroom? No  Shower chair or bench in shower? No  Elevated toilet seat or a handicapped toilet? No   TIMED UP AND GO:  Was the test performed? No .    Cognitive Function:    09/12/2019   10:36 AM 09/05/2018    8:34 AM 09/04/2017    8:15 AM 08/25/2016   11:10 AM  MMSE - Mini Mental State Exam  Orientation to time '5 5 5 5  '$ Orientation to Place '5 5 5 5  '$ Registration '3 3 3 3  '$ Attention/ Calculation 5 0 0 0  Recall '3 3 3 3  '$ Language- name 2 objects  0 0 0  Language- repeat '1 1 1 1  '$ Language- follow 3 step command  '3 3 3  '$ Language- read & follow direction  0 0 0  Write a sentence  0 0 0  Copy design  0 0 0  Total score  '20 20 20        '$ 10/03/2022   12:06 PM  6CIT Screen  What Year? 0 points  What month? 0 points  What time? 0 points  Count back from 20 0 points  Months in reverse 4 points  Repeat phrase 0 points  Total Score 4 points    Immunizations Immunization History  Administered Date(s) Administered   Fluad Quad(high Dose 65+) 08/01/2019, 07/23/2020, 10/04/2021   Influenza Split 08/23/2011, 08/21/2012   Influenza,inj,Quad PF,6+ Mos 08/20/2013, 08/21/2014,  09/08/2015, 08/25/2016, 09/04/2017, 09/05/2018   PFIZER(Purple Top)SARS-COV-2 Vaccination 12/21/2019, 01/13/2020   Pneumococcal Conjugate-13 08/21/2014   Pneumococcal Polysaccharide-23 09/08/2015   Td 06/17/2005, 08/25/2016   Zoster Recombinat (Shingrix) 09/19/2019, 02/16/2020   Zoster, Live 06/28/2012    TDAP status: Up to date  Flu Vaccine status: Due, Education has been provided regarding the importance of this vaccine. Advised may receive this vaccine at local pharmacy or Health Dept. Aware to provide a copy of the vaccination record if obtained from local pharmacy or Health Dept. Verbalized acceptance and understanding.  Pneumococcal vaccine status: Up to date  Covid-19 vaccine status: Information provided on how to obtain vaccines.   Qualifies for Shingles Vaccine? No   Zostavax completed Yes   Shingrix Completed?: Yes  Screening Tests Health Maintenance  Topic Date Due   INFLUENZA VACCINE  06/07/2022   COVID-19 Vaccine (3 - Pfizer risk series) 10/19/2022 (Originally 02/10/2020)   Medicare Annual Wellness ([place])  10/04/2023   COLONOSCOPY (Pts 45-86yr Insurance coverage will need to be confirmed)  10/24/2024   Pneumonia Vaccine 71 Years old  Completed   Hepatitis C Screening  Completed   Zoster Vaccines- Shingrix  Completed   HPV VACCINES  Aged Out    Health Maintenance  Health Maintenance Due  Topic Date Due   INFLUENZA VACCINE  06/07/2022    Colorectal cancer screening: Type of screening: Colonoscopy. Completed 2020. Repeat every 5 years  Lung Cancer Screening: (Low Dose CT Chest recommended if Age 73-80years, 30 pack-year currently smoking OR have quit w/in 15years.) does not qualify.   Lung Cancer Screening Referral:   Additional Screening:  Hepatitis C Screening: does not qualify; Completed 2017  Vision Screening: Recommended annual ophthalmology exams for early detection of glaucoma and other disorders of the eye. Is the patient up to date with their  annual eye exam?  Yes  Who is the provider or what is the name of the office in which the patient attends annual eye exams? [facility] If pt is not established with a provider, would they like to be referred to a provider to establish care? No .   Dental Screening: Recommended annual dental exams for proper oral hygiene  Community Resource Referral / Chronic Care Management: CRR required this visit?  No   CCM required this visit?  No      Plan:     I have personally reviewed and noted the following in the patient's chart:   Medical and social history Use of alcohol, tobacco or illicit drugs  Current medications and supplements including opioid prescriptions. Patient is not currently taking opioid prescriptions. Functional ability and status Nutritional status Physical activity Advanced directives List of other physicians Hospitalizations, surgeries, and ER visits in  previous 12 months Vitals Screenings to include cognitive, depression, and falls Referrals and appointments  In addition, I have reviewed and discussed with patient certain preventive protocols, quality metrics, and best practice recommendations. A written personalized care plan for preventive services as well as general preventive health recommendations were provided to patient.     Leroy Kennedy, LPN   [id number]   Nurse Notes:

## 2022-10-05 ENCOUNTER — Ambulatory Visit (INDEPENDENT_AMBULATORY_CARE_PROVIDER_SITE_OTHER): Payer: Medicare Other | Admitting: Family Medicine

## 2022-10-05 ENCOUNTER — Encounter: Payer: Self-pay | Admitting: Family Medicine

## 2022-10-05 VITALS — BP 126/78 | HR 70 | Temp 97.4°F | Ht 66.5 in | Wt 161.8 lb

## 2022-10-05 DIAGNOSIS — Z0000 Encounter for general adult medical examination without abnormal findings: Secondary | ICD-10-CM | POA: Diagnosis not present

## 2022-10-05 DIAGNOSIS — L989 Disorder of the skin and subcutaneous tissue, unspecified: Secondary | ICD-10-CM | POA: Insufficient documentation

## 2022-10-05 DIAGNOSIS — R7303 Prediabetes: Secondary | ICD-10-CM

## 2022-10-05 DIAGNOSIS — E7800 Pure hypercholesterolemia, unspecified: Secondary | ICD-10-CM

## 2022-10-05 DIAGNOSIS — Z7189 Other specified counseling: Secondary | ICD-10-CM | POA: Diagnosis not present

## 2022-10-05 DIAGNOSIS — K5900 Constipation, unspecified: Secondary | ICD-10-CM | POA: Diagnosis not present

## 2022-10-05 DIAGNOSIS — Z23 Encounter for immunization: Secondary | ICD-10-CM

## 2022-10-05 MED ORDER — PRAVASTATIN SODIUM 40 MG PO TABS: 40.0000 mg | ORAL_TABLET | Freq: Every day | ORAL | 4 refills | Status: DC

## 2022-10-05 NOTE — Assessment & Plan Note (Signed)
Advanced directives: completed this year. Oldest daughter Leanord Hawking is HCPOA. Advised to bring [place] a copy.

## 2022-10-05 NOTE — Assessment & Plan Note (Signed)
Preventative protocols reviewed and updated unless pt declined. Discussed healthy diet and lifestyle.  

## 2022-10-05 NOTE — Patient Instructions (Addendum)
Flu shot today Bring [place] a copy of your advanced directive.  Continue pravastatin and aspirin a few days a week.  Return as needed or in 1 year for next wellness visit/physical.  Health Maintenance After Age 73 After age 21, you are at a higher risk for certain long-term diseases and infections as well as injuries from falls. Falls are a major cause of broken bones and head injuries in people who are older than age 38. Getting regular preventive care can help to keep you healthy and well. Preventive care includes getting regular testing and making lifestyle changes as recommended by your health care provider. Talk with your health care provider about: Which screenings and tests you should have. A screening is a test that checks for a disease when you have no symptoms. A diet and exercise plan that is right for you. What should I know about screenings and tests to prevent falls? Screening and testing are the best ways to find a health problem early. Early diagnosis and treatment give you the best chance of managing medical conditions that are common after age 51. Certain conditions and lifestyle choices may make you more likely to have a fall. Your health care provider may recommend: Regular vision checks. Poor vision and conditions such as cataracts can make you more likely to have a fall. If you wear glasses, make sure to get your prescription updated if your vision changes. Medicine review. Work with your health care provider to regularly review all of the medicines you are taking, including over-the-counter medicines. Ask your health care provider about any side effects that may make you more likely to have a fall. Tell your health care provider if any medicines that you take make you feel dizzy or sleepy. Strength and balance checks. Your health care provider may recommend certain tests to check your strength and balance while standing, walking, or changing positions. Foot health exam. Foot pain and  numbness, as well as not wearing proper footwear, can make you more likely to have a fall. Screenings, including: Osteoporosis screening. Osteoporosis is a condition that causes the bones to get weaker and break more easily. Blood pressure screening. Blood pressure changes and medicines to control blood pressure can make you feel dizzy. Depression screening. You may be more likely to have a fall if you have a fear of falling, feel depressed, or feel unable to do activities that you used to do. Alcohol use screening. Using too much alcohol can affect your balance and may make you more likely to have a fall. Follow these instructions at home: Lifestyle Do not drink alcohol if: Your health care provider tells you not to drink. If you drink alcohol: Limit how much you have to: 0-1 drink a day for women. 0-2 drinks a day for men. Know how much alcohol is in your drink. In the [place] one drink equals one 12 oz bottle of beer (355 mL), one 5 oz glass of wine (148 mL), or one 1 oz glass of hard liquor (44 mL). Do not use any products that contain nicotine or tobacco. These products include cigarettes, chewing tobacco, and vaping devices, such as e-cigarettes. If you need help quitting, ask your health care provider. Activity  Follow a regular exercise program to stay fit. This will help you maintain your balance. Ask your health care provider what types of exercise are appropriate for you. If you need a cane or walker, use it as recommended by your health care provider. Wear supportive shoes  that have nonskid soles. Safety  Remove any tripping hazards, such as rugs, cords, and clutter. Install safety equipment such as grab bars in bathrooms and safety rails on stairs. Keep rooms and walkways well-lit. General instructions Talk with your health care provider about your risks for falling. Tell your health care provider if: You fall. Be sure to tell your health care provider about all falls, even  ones that seem minor. You feel dizzy, tiredness (fatigue), or off-balance. Take over-the-counter and prescription medicines only as told by your health care provider. These include supplements. Eat a healthy diet and maintain a healthy weight. A healthy diet includes low-fat dairy products, low-fat (lean) meats, and fiber from whole grains, beans, and lots of fruits and vegetables. Stay current with your vaccines. Schedule regular health, dental, and eye exams. Summary Having a healthy lifestyle and getting preventive care can help to protect your health and wellness after age 43. Screening and testing are the best way to find a health problem early and help you avoid having a fall. Early diagnosis and treatment give you the best chance for managing medical conditions that are more common for people who are older than age 66. Falls are a major cause of broken bones and head injuries in people who are older than age 36. Take precautions to prevent a fall at home. Work with your health care provider to learn what changes you can make to improve your health and wellness and to prevent falls. This information is not intended to replace advice given to you by your health care provider. Make sure you discuss any questions you have with your health care provider. Document Revised: 03/15/2021 Document Reviewed: 03/15/2021 Elsevier Patient Education  [place].

## 2022-10-05 NOTE — Assessment & Plan Note (Signed)
Small skin lesion present to skin above left upper lip - rec abx ointment topically x 1-2 wks, update if not improved for derm referral r/o BCC.

## 2022-10-05 NOTE — Assessment & Plan Note (Signed)
Encouraged limiting added sugars in diet

## 2022-10-05 NOTE — Assessment & Plan Note (Signed)
Chronic, stable period on pravastatin - continue. The 10-year ASCVD risk score (Arnett DK, et al., 2019) is: 20.4%   Values used to calculate the score:     Age: 71 years     Sex: Male     Is Non-Hispanic African American: No     Diabetic: No     Tobacco smoker: No     Systolic Blood Pressure: 960 mmHg     Is BP treated: No     HDL Cholesterol: 41.2 mg/dL     Total Cholesterol: 146 mg/dL

## 2022-10-05 NOTE — Progress Notes (Signed)
Patient ID: Bryan Barker, male    DOB: 11/18/1950, 71 y.o.   MRN: [id number]  This visit was conducted in person.  BP 126/78   Pulse 70   Temp (!) 97.4 F (36.3 C) (Temporal)   Ht 5' 6.5" (1.689 m)   Wt 161 lb 12.8 oz (73.4 kg)   SpO2 99%   BMI 25.72 kg/m    CC: CPE Subjective:   HPI: Bryan Barker is a 71 y.o. male presenting on 10/05/2022 for Annual Exam (MCR prt 2. )   Saw health advisor this week for medicare wellness visit. Note reviewed.   No results found.  Flowsheet Row Clinical Support from 10/03/2022 in [facility]  PHQ-2 Total Score 0          10/03/2022   12:03 PM 09/28/2021    1:18 PM 09/29/2020    8:21 AM 09/12/2019   10:33 AM 09/05/2018    8:33 AM  Fall Risk   Falls in the past year? 0 0 0 0 No  Number falls in past yr: 0 0  0   Injury with Fall? 0 0  0   Risk for fall due to :  No Fall Risks     Follow up Falls evaluation completed;Education provided;Falls prevention discussed Falls prevention discussed  Falls evaluation completed;Falls prevention discussed      Preventative: COLONOSCOPY Date: 10/2014 sig diverticulosis, rpt 5 yrs Carlean Purl) COLONOSCOPY 10/2019 - 2 TA, diverticulosis, rpt 5 yrs Carlean Purl) Prostate screening - yearly PSA. No nocturia. Strong stream.  Lung cancer screening - not eligible  Flu shot yearly  [place]ios 12/2019, 01/2020  Prevnar-13 08/2014, pneumovax 2016 Tetanus 08/2016 Zostavax - 06/2012  Shingrix - 09/2019, 02/2020 Advanced directives: completed this year. Oldest daughter Bryan Barker is HCPOA. Advised to bring [place] a copy.  Seat belt use discussed.  Sunscreen use discussed. No changing moles on skin.  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 Interim medical history since our last visit reviewed. Allergies and medications reviewed and updated. Outpatient Medications Prior to Visit  Medication Sig Dispense Refill   aspirin 81 MG EC tablet Take 1 tablet (81 mg total) by mouth every Monday, Wednesday, and Friday. Swallow whole.     docusate sodium (COLACE) 100 MG capsule Take 100 mg by mouth daily.     Psyllium (METAMUCIL PO) Take by mouth daily.     pravastatin (PRAVACHOL) 40 MG tablet Take 1 tablet (40 mg total) by mouth daily. 90 tablet 4   No facility-administered medications prior to visit.     Per HPI unless specifically indicated in ROS section below Review of Systems  Constitutional:  Negative for activity change, appetite change, chills, fatigue, fever and unexpected weight change.  HENT:  Negative for hearing loss.   Eyes:  Negative for visual disturbance.  Respiratory:  Negative for cough, chest tightness, shortness of breath and wheezing.  Cardiovascular:  Negative for chest pain, palpitations and leg swelling.  Gastrointestinal:  Negative for abdominal distention, abdominal pain, blood in stool, constipation, diarrhea, nausea and vomiting.  Genitourinary:  Negative for difficulty urinating and hematuria.  Musculoskeletal:  Negative for arthralgias, myalgias and neck pain.  Skin:  Negative for rash.  Neurological:  Negative for dizziness, seizures, syncope and headaches.  Hematological:  Negative for adenopathy. Does not bruise/bleed easily.  Psychiatric/Behavioral:  Negative for dysphoric mood. The patient is not nervous/anxious.     Objective:  BP 126/78   Pulse 70   Temp (!) 97.4 F  (36.3 C) (Temporal)   Ht 5' 6.5" (1.689 m)   Wt 161 lb 12.8 oz (73.4 kg)   SpO2 99%   BMI 25.72 kg/m   Wt Readings from Last 3 Encounters:  10/05/22 161 lb 12.8 oz (73.4 kg)  10/04/21 161 lb 9 oz (73.3 kg)  09/28/21 160 lb (72.6 kg)      Physical Exam Vitals and nursing note reviewed.  Constitutional:      General: He is not in acute distress.    Appearance: Normal appearance. He is well-developed. He is not ill-appearing.  HENT:     Head: Normocephalic and atraumatic.     Right Ear: Hearing, tympanic membrane, ear canal and external ear normal.     Left Ear: Hearing, tympanic membrane, ear canal and external ear normal.     Nose: Nose normal.     Mouth/Throat:     Mouth: Mucous membranes are moist.     Pharynx: Oropharynx is clear. No oropharyngeal exudate or posterior oropharyngeal erythema.  Eyes:     General: No scleral icterus.    Extraocular Movements: Extraocular movements intact.     Conjunctiva/sclera: Conjunctivae normal.     Pupils: Pupils are equal, round, and reactive to light.  Neck:     Thyroid: No thyroid mass or thyromegaly.  Cardiovascular:     Rate and Rhythm: Normal rate and regular rhythm.     Pulses: Normal pulses.          Radial pulses are 2+ on the right side and 2+ on the left side.     Heart sounds: Normal heart sounds. No murmur heard. Pulmonary:     Effort: Pulmonary effort is normal. No respiratory distress.     Breath sounds: Normal breath sounds. No wheezing, rhonchi or rales.  Abdominal:     General: Bowel sounds are normal. There is no distension.     Palpations: Abdomen is soft. There is no mass.     Tenderness: There is no abdominal tenderness. There is no guarding or rebound.     Hernia: No hernia is present.  Musculoskeletal:        General: Normal range of motion.     Cervical back: Normal range of motion and neck supple.     Right lower leg: No edema.     Left lower leg: No edema.  Lymphadenopathy:     Cervical: No cervical  adenopathy.  Skin:    General: Skin is warm and dry.     Findings: Lesion present. No rash.     Comments: Papule to left upper lip with possible telangectasia without surrounding erythema or drainage  Neurological:     General: No focal deficit present.     Mental Status: He is alert and oriented to person, place, and time.  Psychiatric:        Mood and Affect: Mood normal.  Behavior: Behavior normal.        Thought Content: Thought content normal.        Judgment: Judgment normal.       Results for orders placed or performed in visit on 09/28/22  TSH  Result Value Ref Range   TSH 2.22 0.35 - 5.50 uIU/mL  PSA, Medicare  Result Value Ref Range   PSA 2.39 0.10 - 4.00 ng/ml  Hemoglobin A1c  Result Value Ref Range   Hgb A1c MFr Bld 5.8 4.6 - 6.5 %  Lipid panel  Result Value Ref Range   Cholesterol 146 0 - 200 mg/dL   Triglycerides 149.0 0.0 - 149.0 mg/dL   HDL 41.20 >39.00 mg/dL   VLDL 29.8 0.0 - 40.0 mg/dL   LDL Cholesterol 75 0 - 99 mg/dL   Total CHOL/HDL Ratio 4    NonHDL 104.49   Comprehensive metabolic panel  Result Value Ref Range   Sodium 140 135 - 145 mEq/L   Potassium 4.9 3.5 - 5.1 mEq/L   Chloride 102 96 - 112 mEq/L   CO2 32 19 - 32 mEq/L   Glucose, Bld 101 (H) 70 - 99 mg/dL   BUN 12 6 - 23 mg/dL   Creatinine, Ser 1.03 0.40 - 1.50 mg/dL   Total Bilirubin 0.6 0.2 - 1.2 mg/dL   Alkaline Phosphatase 77 39 - 117 U/L   AST 16 0 - 37 U/L   ALT 11 0 - 53 U/L   Total Protein 6.9 6.0 - 8.3 g/dL   Albumin 4.2 3.5 - 5.2 g/dL   GFR 72.09 >60.00 mL/min   Calcium 9.4 8.4 - 10.5 mg/dL    Assessment & Plan:   Problem List Items Addressed This Visit       Unprioritized   Healthcare maintenance - Primary (Chronic)    Preventative protocols reviewed and updated unless pt declined. Discussed healthy diet and lifestyle.       Advanced care planning/counseling discussion (Chronic)    Advanced directives: completed this year. Oldest daughter Bryan Barker is  HCPOA. Advised to bring [place] a copy.       Hypercholesteremia    Chronic, stable period on pravastatin - continue. The 10-year ASCVD risk score (Arnett DK, et al., 2019) is: 20.4%   Values used to calculate the score:     Age: 26 years     Sex: Male     Is Non-Hispanic African American: No     Diabetic: No     Tobacco smoker: No     Systolic Blood Pressure: 825 mmHg     Is BP treated: No     HDL Cholesterol: 41.2 mg/dL     Total Cholesterol: 146 mg/dL       Relevant Medications   pravastatin (PRAVACHOL) 40 MG tablet   Prediabetes    Encouraged limiting added sugars in diet      Constipation    Well managed with colace and fiber.       Skin lesion    Small skin lesion present to skin above left upper lip - rec abx ointment topically x 1-2 wks, update if not improved for derm referral r/o BCC.       Other Visit Diagnoses     Need for influenza vaccination       Relevant Orders   Flu Vaccine QUAD High Dose(Fluad) (Completed)        Meds ordered this encounter  Medications   pravastatin (PRAVACHOL) 40 MG tablet  Sig: Take 1 tablet (40 mg total) by mouth daily.    Dispense:  90 tablet    Refill:  4   Orders Placed This Encounter  Procedures   Flu Vaccine QUAD High Dose(Fluad)     Patient instructions: Flu shot today Bring [place] a copy of your advanced directive.  Continue pravastatin and aspirin a few days a week.  Return as needed or in 1 year for next wellness visit/physical.  Follow up plan: Return in about 1 year (around 10/06/2023) for annual exam, prior fasting for blood work, medicare wellness visit.  Ria Bush, MD

## 2022-10-05 NOTE — Assessment & Plan Note (Signed)
Well managed with colace and fiber.

## 2022-11-08 ENCOUNTER — Other Ambulatory Visit: Payer: Self-pay | Admitting: Family Medicine

## 2022-11-11 NOTE — Telephone Encounter (Signed)
Too soon. Rx sent on 10/05/22, #90/4 to [facility].

## 2022-12-20 ENCOUNTER — Ambulatory Visit (INDEPENDENT_AMBULATORY_CARE_PROVIDER_SITE_OTHER): Payer: Medicare Other | Admitting: Podiatry

## 2022-12-20 DIAGNOSIS — M79675 Pain in left toe(s): Secondary | ICD-10-CM | POA: Diagnosis not present

## 2022-12-20 DIAGNOSIS — B351 Tinea unguium: Secondary | ICD-10-CM | POA: Diagnosis not present

## 2022-12-20 DIAGNOSIS — M79674 Pain in right toe(s): Secondary | ICD-10-CM

## 2022-12-20 NOTE — Progress Notes (Signed)
   Chief Complaint  Patient presents with   Nail Problem    Routine foot care, nail trim     SUBJECTIVE Patient presents to office today complaining of elongated, thickened nails that cause pain while ambulating in shoes.  He is unable to trim his own nails. Patient is here for further evaluation and treatment.  Past Medical History:  Diagnosis Date   Adenomatous colon polyp 2012   Cataract 2019   bilateral eyes   Dehydration 03/2011   ER for syncope attributed to dehydration   Diverticulosis    by colonoscopy   Hemorrhoids    Hyperlipidemia     OBJECTIVE General Patient is awake, alert, and oriented x 3 and in no acute distress. Derm Skin is dry and supple bilateral. Negative open lesions or macerations. Remaining integument unremarkable. Nails are tender, long, thickened and dystrophic with subungual debris, consistent with onychomycosis, 1-5 bilateral. No signs of infection noted. Vasc  DP and PT pedal pulses palpable bilaterally. Temperature gradient within normal limits.  Neuro Epicritic and protective threshold sensation grossly intact bilaterally.  Musculoskeletal Exam No symptomatic pedal deformities noted bilateral. Muscular strength within normal limits.  ASSESSMENT 1. Onychodystrophic nails 1-5 bilateral with hyperkeratosis of nails.  2. Onychomycosis of nail due to dermatophyte bilateral 3. Pain in foot bilateral  PLAN OF CARE 1. Patient evaluated today.  2. Instructed to maintain good pedal hygiene and foot care.  3. Mechanical debridement of nails 1-5 bilaterally performed using a nail nipper. Filed with dremel without incident.  4. Return to clinic in 3 mos.   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    Edrick Kins, DPM [facility]  Dr. Edrick Kins, DPM    [address], [place] [address]                Office [phone]  Fax [phone]

## 2023-03-21 ENCOUNTER — Ambulatory Visit (INDEPENDENT_AMBULATORY_CARE_PROVIDER_SITE_OTHER): Payer: Medicare Other | Admitting: Podiatry

## 2023-03-21 DIAGNOSIS — M79675 Pain in left toe(s): Secondary | ICD-10-CM | POA: Diagnosis not present

## 2023-03-21 DIAGNOSIS — M79674 Pain in right toe(s): Secondary | ICD-10-CM

## 2023-03-21 DIAGNOSIS — B351 Tinea unguium: Secondary | ICD-10-CM | POA: Diagnosis not present

## 2023-03-21 NOTE — Progress Notes (Signed)
   Chief Complaint  Patient presents with   Nail Problem    Routine foot care, nail trim     SUBJECTIVE Patient presents to office today complaining of elongated, thickened nails that cause pain while ambulating in shoes.  He is unable to trim his own nails. Patient is here for further evaluation and treatment.  Past Medical History:  Diagnosis Date   Adenomatous colon polyp 2012   Cataract 2019   bilateral eyes   Dehydration 03/2011   ER for syncope attributed to dehydration   Diverticulosis    by colonoscopy   Hemorrhoids    Hyperlipidemia     OBJECTIVE General Patient is awake, alert, and oriented x 3 and in no acute distress. Derm Skin is dry and supple bilateral. Negative open lesions or macerations. Remaining integument unremarkable. Nails are tender, long, thickened and dystrophic with subungual debris, consistent with onychomycosis, 1-5 bilateral. No signs of infection noted. Vasc  DP and PT pedal pulses palpable bilaterally. Temperature gradient within normal limits.  Neuro Epicritic and protective threshold sensation grossly intact bilaterally.  Musculoskeletal Exam No symptomatic pedal deformities noted bilateral. Muscular strength within normal limits.  ASSESSMENT 1. Onychodystrophic nails 1-5 bilateral with hyperkeratosis of nails.  2. Onychomycosis of nail due to dermatophyte bilateral   PLAN OF CARE 1. Patient evaluated today.  2. Instructed to maintain good pedal hygiene and foot care.  3. Mechanical debridement of nails 1-5 bilaterally performed using a nail nipper. Filed with dremel without incident.  4. Return to clinic in 3 mos.   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    Felecia Shelling, DPM [facility]  Dr. Felecia Shelling, DPM    [address] [place], [place] [address]                Office [phone]  Fax [phone]

## 2023-03-30 ENCOUNTER — Encounter: Payer: Self-pay | Admitting: Family Medicine

## 2023-03-30 ENCOUNTER — Ambulatory Visit (INDEPENDENT_AMBULATORY_CARE_PROVIDER_SITE_OTHER): Payer: Medicare Other | Admitting: Family Medicine

## 2023-03-30 VITALS — BP 120/66 | HR 79 | Temp 98.0°F | Ht 66.5 in | Wt 160.2 lb

## 2023-03-30 DIAGNOSIS — H6123 Impacted cerumen, bilateral: Secondary | ICD-10-CM

## 2023-03-30 NOTE — Progress Notes (Signed)
Patient ID: Bryan Barker, male    DOB: 1951-05-02, 72 y.o.   MRN: [id number]  This visit was conducted in person.  BP 120/66 (BP Location: Left Arm, Patient Position: Sitting, Cuff Size: Normal)   Pulse 79   Temp 98 F (36.7 C) (Temporal)   Ht 5' 6.5" (1.689 m)   Wt 160 lb 4 oz (72.7 kg)   SpO2 97%   BMI 25.48 kg/m    CC:  Chief Complaint  Patient presents with   Cerumen Impaction    Left    Subjective:   HPI: Bryan Barker is a 72 y.o. male presenting on 03/30/2023 for Cerumen Impaction (Left)   He has noted to bilateral ear pressure and feels that his left ear may be clogged.  Decreased hearing in the left ear.  He denies cough congestion fever and shortness of breath     Relevant past medical, surgical, family and social history reviewed and updated as indicated. Interim medical history since our last visit reviewed. Allergies and medications reviewed and updated. Outpatient Medications Prior to Visit  Medication Sig Dispense Refill   aspirin 81 MG EC tablet Take 1 tablet (81 mg total) by mouth every Monday, Wednesday, and Friday. Swallow whole.     docusate sodium (COLACE) 100 MG capsule Take 100 mg by mouth daily.     pravastatin (PRAVACHOL) 40 MG tablet Take 1 tablet (40 mg total) by mouth daily. 90 tablet 4   Psyllium (METAMUCIL PO) Take by mouth daily.     No facility-administered medications prior to visit.     Per HPI unless specifically indicated in ROS section below Review of Systems  Constitutional:  Negative for fatigue and fever.  HENT:  Negative for ear pain.   Eyes:  Negative for pain.  Respiratory:  Negative for cough and shortness of breath.   Cardiovascular:  Negative for chest pain, palpitations and leg swelling.  Gastrointestinal:  Negative for abdominal pain.  Genitourinary:  Negative for dysuria.  Musculoskeletal:  Negative for arthralgias.  Neurological:  Negative for syncope, light-headedness and headaches.  Psychiatric/Behavioral:   Negative for dysphoric mood.    Objective:  BP 120/66 (BP Location: Left Arm, Patient Position: Sitting, Cuff Size: Normal)   Pulse 79   Temp 98 F (36.7 C) (Temporal)   Ht 5' 6.5" (1.689 m)   Wt 160 lb 4 oz (72.7 kg)   SpO2 97%   BMI 25.48 kg/m   Wt Readings from Last 3 Encounters:  03/30/23 160 lb 4 oz (72.7 kg)  10/05/22 161 lb 12.8 oz (73.4 kg)  10/04/21 161 lb 9 oz (73.3 kg)      Physical Exam Constitutional:      General: He is not in acute distress.    Appearance: Normal appearance. He is well-developed. He is not ill-appearing or toxic-appearing.  HENT:     Head: Normocephalic and atraumatic.     Right Ear: Hearing, tympanic membrane, ear canal and external ear normal. No tenderness. There is impacted cerumen. No foreign body. Tympanic membrane is not retracted or bulging.     Left Ear: Hearing, tympanic membrane, ear canal and external ear normal. No tenderness. There is impacted cerumen. No foreign body. Tympanic membrane is not retracted or bulging.     Nose: Nose normal. No mucosal edema or rhinorrhea.     Right Sinus: No maxillary sinus tenderness or frontal sinus tenderness.     Left Sinus: No maxillary sinus tenderness or frontal sinus tenderness.  Mouth/Throat:     Dentition: Normal dentition. No dental caries.     Pharynx: Uvula midline. No oropharyngeal exudate.     Tonsils: No tonsillar abscesses.  Eyes:     General: Lids are normal. Lids are everted, no foreign bodies appreciated.     Conjunctiva/sclera: Conjunctivae normal.     Pupils: Pupils are equal, round, and reactive to light.  Neck:     Thyroid: No thyroid mass or thyromegaly.     Vascular: No carotid bruit.     Trachea: Trachea and phonation normal.  Cardiovascular:     Rate and Rhythm: Normal rate and regular rhythm.     Pulses: Normal pulses.     Heart sounds: Normal heart sounds, S1 normal and S2 normal. No murmur heard.    No gallop.  Pulmonary:     Effort: Pulmonary effort is normal.  No respiratory distress.     Breath sounds: Normal breath sounds. No wheezing, rhonchi or rales.  Musculoskeletal:     Cervical back: Normal range of motion and neck supple.  Skin:    General: Skin is warm and dry.     Findings: No rash.  Psychiatric:        Speech: Speech normal.        Behavior: Behavior normal.        Judgment: Judgment normal.       Results for orders placed or performed in visit on 09/28/22  TSH  Result Value Ref Range   TSH 2.22 0.35 - 5.50 uIU/mL  PSA, Medicare  Result Value Ref Range   PSA 2.39 0.10 - 4.00 ng/ml  Hemoglobin A1c  Result Value Ref Range   Hgb A1c MFr Bld 5.8 4.6 - 6.5 %  Lipid panel  Result Value Ref Range   Cholesterol 146 0 - 200 mg/dL   Triglycerides 409.8 0.0 - 149.0 mg/dL   HDL 11.91 >47.82 mg/dL   VLDL 95.6 0.0 - 21.3 mg/dL   LDL Cholesterol 75 0 - 99 mg/dL   Total CHOL/HDL Ratio 4    NonHDL 104.49   Comprehensive metabolic panel  Result Value Ref Range   Sodium 140 135 - 145 mEq/L   Potassium 4.9 3.5 - 5.1 mEq/L   Chloride 102 96 - 112 mEq/L   CO2 32 19 - 32 mEq/L   Glucose, Bld 101 (H) 70 - 99 mg/dL   BUN 12 6 - 23 mg/dL   Creatinine, Ser 0.86 0.40 - 1.50 mg/dL   Total Bilirubin 0.6 0.2 - 1.2 mg/dL   Alkaline Phosphatase 77 39 - 117 U/L   AST 16 0 - 37 U/L   ALT 11 0 - 53 U/L   Total Protein 6.9 6.0 - 8.3 g/dL   Albumin 4.2 3.5 - 5.2 g/dL   GFR 57.84 >69.62 mL/min   Calcium 9.4 8.4 - 10.5 mg/dL    Assessment and Plan  Bilateral impacted cerumen  Cerumen impaction removal via irrigation Performed by : Terese Door Consent for procedure obtained verbally. Bilateral ears lavaged/ irrigated with warm water gently. Pt tolerated procedure well, with no complications. After procedure ears clear of cerumen impaction, with minimal ear canal irritation and redness, no bleeding. TM intact and symptoms improved.   No follow-ups on file.   Kerby Nora, MD

## 2023-06-23 ENCOUNTER — Ambulatory Visit (INDEPENDENT_AMBULATORY_CARE_PROVIDER_SITE_OTHER): Payer: Medicare Other | Admitting: Podiatry

## 2023-06-23 DIAGNOSIS — B351 Tinea unguium: Secondary | ICD-10-CM

## 2023-06-23 DIAGNOSIS — M79675 Pain in left toe(s): Secondary | ICD-10-CM | POA: Diagnosis not present

## 2023-06-23 DIAGNOSIS — M79674 Pain in right toe(s): Secondary | ICD-10-CM

## 2023-06-23 NOTE — Progress Notes (Signed)
   Chief Complaint  Patient presents with   Nail Problem    RFC,right great toe lateral border is sore since about 2 weeks ago    SUBJECTIVE Patient presents to office today complaining of elongated, thickened nails that cause pain while ambulating in shoes.  He is unable to trim his own nails. Patient is here for further evaluation and treatment.  Past Medical History:  Diagnosis Date   Adenomatous colon polyp 2012   Cataract 2019   bilateral eyes   Dehydration 03/2011   ER for syncope attributed to dehydration   Diverticulosis    by colonoscopy   Hemorrhoids    Hyperlipidemia     OBJECTIVE General Patient is awake, alert, and oriented x 3 and in no acute distress. Derm Skin is dry and supple bilateral. Negative open lesions or macerations. Remaining integument unremarkable. Nails are tender, long, thickened and dystrophic with subungual debris, consistent with onychomycosis, 1-5 bilateral. No signs of infection noted. Vasc  DP and PT pedal pulses palpable bilaterally. Temperature gradient within normal limits.  Neuro Epicritic and protective threshold sensation grossly intact bilaterally.  Musculoskeletal Exam No symptomatic pedal deformities noted bilateral. Muscular strength within normal limits.  ASSESSMENT 1. Onychodystrophic nails 1-5 bilateral with hyperkeratosis of nails.  2. Onychomycosis of nail due to dermatophyte bilateral   PLAN OF CARE 1. Patient evaluated today.  2. Instructed to maintain good pedal hygiene and foot care.  3. Mechanical debridement of nails 1-5 bilaterally performed using a nail nipper. Filed with dremel without incident.  4. Return to clinic in 3 mos.   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    Felecia Shelling, DPM [facility]  Dr. Felecia Shelling, DPM    [address] [place], [place] [address]                Office [phone]  Fax [phone]

## 2023-09-07 ENCOUNTER — Ambulatory Visit: Payer: Medicare Other

## 2023-09-07 VITALS — Ht 70.0 in | Wt 165.0 lb

## 2023-09-07 DIAGNOSIS — Z0000 Encounter for general adult medical examination without abnormal findings: Secondary | ICD-10-CM

## 2023-09-07 NOTE — Progress Notes (Signed)
Subjective:   Bryan Barker is a 72 y.o. male who presents for Medicare Annual/Subsequent preventive examination.  Visit Complete: Virtual I connected with  Cherylann Parr on 09/07/23 by a audio enabled telemedicine application and verified that I am speaking with the correct person using two identifiers.  Patient Location: Home  Provider Location: Home Office  I discussed the limitations of evaluation and management by telemedicine. The patient expressed understanding and agreed to proceed.  Vital Signs: Because this visit was a virtual/telehealth visit, some criteria may be missing or patient reported. Any vitals not documented were not able to be obtained and vitals that have been documented are patient reported.  Patient Medicare AWV questionnaire was completed by the patient on 09/07/2023; I have confirmed that all information answered by patient is correct and no changes since this date.  Cardiac Risk Factors include: advanced age (>50men, >87 women);male gender;dyslipidemia     Objective:    Today's Vitals   09/07/23 0901  Weight: 165 lb (74.8 kg)  Height: 5\' 10"  (1.778 m)   Body mass index is 23.68 kg/m.     09/07/2023    9:04 AM 10/03/2022   12:03 PM 09/28/2021    1:16 PM 10/25/2019    7:19 AM 09/12/2019   10:32 AM 09/05/2018    8:34 AM 09/04/2017    8:15 AM  Advanced Directives  Does Patient Have a Medical Advance Directive? Yes Yes Yes No No No No  Type of Estate agent of [place];Living will Healthcare Power of [employer] of [place];Living will      Does patient want to make changes to medical advance directive? No - Patient declined  Yes (MAU/Ambulatory/Procedural Areas - Information given)      Copy of Healthcare Power of Attorney in Chart? Yes - validated most recent copy scanned in chart (See row information) Yes - validated most recent copy scanned in chart (See row information)       Would patient like information on  creating a medical advance directive?    No - Patient declined Yes (MAU/Ambulatory/Procedural Areas - Information given) Yes (MAU/Ambulatory/Procedural Areas - Information given) No - Patient declined    Current Medications (verified) Outpatient Encounter Medications as of 09/07/2023  Medication Sig   aspirin 81 MG EC tablet Take 1 tablet (81 mg total) by mouth every Monday, Wednesday, and Friday. Swallow whole.   docusate sodium (COLACE) 100 MG capsule Take 100 mg by mouth daily.   pravastatin (PRAVACHOL) 40 MG tablet Take 1 tablet (40 mg total) by mouth daily.   Psyllium (METAMUCIL PO) Take by mouth daily.   No facility-administered encounter medications on file as of 09/07/2023.    Allergies (verified) Patient has no known allergies.   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 (Murphy/Wainer)    Family History  Problem Relation Age  of Onset   Diabetes Mother    Hypertension Mother    Heart disease Mother        Pacer   Colon polyps Mother    Coronary artery disease Father 62       MI   Cancer Brother        esoph, mets to brain   Diabetes Brother    Alcohol abuse Sister    Colon cancer Neg Hx    Stroke Neg Hx    Esophageal cancer Neg Hx    Stomach cancer Neg Hx    Rectal cancer Neg Hx    Social History   Socioeconomic History   Marital status: Married    Spouse name: Not on file   Number of children: 2    Years of education: Not on file   Highest education level: Not on file  Occupational History   Occupation: retired    Comment: retiring 2/11  Tobacco Use   Smoking status: Former    Current packs/day: 0.00    Average packs/day: 1 pack/day for 3.0 years (3.0 ttl pk-yrs)    Types: Cigarettes    Start date: 06/16/1965    Quit date: 06/16/1968    Years since quitting: 55.2   Smokeless tobacco: Never   Tobacco comments:    only smoked as a teenager  Vaping Use   Vaping status: Never Used  Substance and Sexual Activity   Alcohol use: No    Alcohol/week: 0.0 standard drinks of alcohol   Drug use: No   Sexual activity: Yes  Other Topics Concern   Not on file  Social History Narrative   Caffeine: 1 cup/day   Married and lives with wife, 1 dog, cows   2 daughters   Occupation: retired, 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, 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   Diet: good water, fruits/vegetables daily   Social Determinants of Health   Financial Resource Strain: Low Risk  (09/07/2023)   Overall Financial Resource Strain (CARDIA)    Difficulty of Paying Living Expenses: Not hard at all  Food Insecurity: No Food Insecurity (09/07/2023)   Hunger Vital Sign    Worried About Running Out of Food in the Last Year: Never true    Ran Out of Food in the Last Year: Never true  Transportation Needs: No Transportation Needs (09/07/2023)   PRAPARE - Administrator, Civil Service (Medical): No    Lack of Transportation (Non-Medical): No  Physical Activity: Sufficiently Active (09/07/2023)   Exercise Vital Sign    Days of Exercise per Week: 5 days    Minutes of Exercise per Session: 30 min  Stress: No Stress Concern Present (09/07/2023)   [employer] of Occupational Health - Occupational Stress Questionnaire    Feeling of Stress : Not at all  Social Connections: Socially Integrated (09/07/2023)   Social Connection and Isolation Panel [NHANES]    Frequency of  Communication with Friends and Family: More than three times a week    Frequency of Social Gatherings with Friends and Family: More than three times a week    Attends Religious Services: More than 4 times per year    Active Member of [employer] or Organizations: Yes    Attends Engineer, structural: More than 4 times per year    Marital Status: Married    Tobacco Counseling Counseling given: Not Answered Tobacco comments: only smoked as a teenager   Clinical Intake:  Pre-visit preparation completed: Yes  Pain : No/denies pain     Nutritional Risks: None Diabetes: No  How often do you need to have someone help you when you read instructions, pamphlets, or other written materials from your doctor or pharmacy?: 1 - Never  Interpreter Needed?: No  Information entered by :: Renie Ora, LPN   Activities of Daily Living    09/07/2023    9:04 AM 10/03/2022   12:08 PM  In your present state of health, do you have any difficulty performing the following activities:  Hearing? 0 1  Vision? 0 0  Difficulty concentrating or making decisions? 0 0  Walking or climbing stairs? 0 0  Dressing or bathing? 0 0  Doing errands, shopping? 0 0  Preparing Food and eating ? N N  Using the Toilet? N N  In the past six months, have you accidently leaked urine? N N  Do you have problems with loss of bowel control? N N  Managing your Medications? N N  Managing your Finances? N N  Housekeeping or managing your Housekeeping? N N    Patient Care Team: Eustaquio Boyden, MD as PCP - General (Family Medicine) Breck Coons, DDS as Referring Physician (Dentistry) Iva Boop, MD as Consulting Physician (Gastroenterology)  Indicate any recent Medical Services you may have received from other than [facility] providers in the past year (date may be approximate).     Assessment:   This is a routine wellness examination for [place].  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        Depression Screen    09/07/2023    9:03 AM 10/03/2022   12:09 PM 09/28/2021    1:20 PM 09/29/2020    8:21 AM 09/12/2019   10:33 AM 09/05/2018    8:33 AM 09/04/2017    8:14 AM  PHQ 2/9 Scores  PHQ - 2 Score 0 0 0 0 0 0 0  PHQ- 9 Score  0   0 0 0    Fall Risk    09/07/2023    9:02 AM 03/30/2023    3:22 PM 10/03/2022   12:03 PM 09/28/2021    1:18 PM 09/29/2020    8:21 AM  Fall Risk   Falls in the past year? 0 0 0 0 0  Number falls in past yr: 0 0 0 0   Injury with Fall? 0 0 0 0   Risk for fall due to : No Fall Risks No Fall Risks  No Fall Risks   Follow up Falls prevention discussed Falls evaluation completed Falls evaluation completed;Education provided;Falls prevention discussed Falls prevention discussed     MEDICARE RISK AT HOME: Medicare Risk at Home Any stairs in or around the home?: No If so, are there any without handrails?: No Home free of loose throw rugs in walkways, pet beds, electrical cords, etc?: Yes Adequate lighting in your home to reduce risk of falls?: Yes Life alert?: No Use of a cane, walker or w/c?: No Grab bars in the bathroom?: Yes Shower chair or bench in shower?: Yes Elevated toilet seat or a handicapped toilet?: Yes  TIMED UP AND GO:  Was the test performed?  No    Cognitive Function:    09/12/2019   10:36 AM 09/05/2018    8:34 AM 09/04/2017    8:15 AM 08/25/2016   11:10 AM  MMSE - Mini Mental State Exam  Orientation to time 5 5 5 5   Orientation to Place 5 5 5 5   Registration 3 3 3 3   Attention/ Calculation 5 0 0 0  Recall 3 3 3 3   Language- name 2 objects  0 0 0  Language- repeat 1 1 1 1   Language- follow 3 step command  3 3 3   Language- read & follow direction  0 0 0   Write a sentence  0 0 0  Copy design  0 0 0  Total score  20 20 20         09/07/2023    9:04 AM 10/03/2022   12:06 PM  6CIT Screen  What Year? 0 points 0 points  What month? 0 points 0 points  What time? 0 points 0 points  Count back from 20 0 points 0 points  Months in reverse 0 points 4 points  Repeat phrase 0 points 0 points  Total Score 0 points 4 points    Immunizations Immunization History  Administered Date(s) Administered   Fluad Quad(high Dose 65+) 08/01/2019, 07/23/2020, 10/04/2021, 10/05/2022   Influenza Split 08/23/2011, 08/21/2012   Influenza,inj,Quad PF,6+ Mos 08/20/2013, 08/21/2014, 09/08/2015, 08/25/2016, 09/04/2017, 09/05/2018   PFIZER(Purple Top)SARS-COV-2 Vaccination 12/21/2019, 01/13/2020   Pneumococcal Conjugate-13 08/21/2014   Pneumococcal Polysaccharide-23 09/08/2015   Td 06/17/2005, 08/25/2016   Zoster Recombinant(Shingrix) 09/19/2019, 02/16/2020   Zoster, Live 06/28/2012    TDAP status: Up to date  Flu Vaccine status: Due, Education has been provided regarding the importance of this vaccine. Advised may receive this vaccine at local pharmacy or Health Dept. Aware to provide a copy of the vaccination record if obtained from local pharmacy or Health Dept. Verbalized acceptance and understanding.  Pneumococcal vaccine status: Up to date  Covid-19 vaccine status: Completed vaccines  Qualifies for Shingles Vaccine? Yes   Zostavax completed Yes   Shingrix Completed?: Yes  Screening Tests Health Maintenance  Topic Date Due   INFLUENZA VACCINE  06/08/2023   COVID-19 Vaccine (3 - 2023-24 season) 07/09/2023   Medicare Annual Wellness (AWV)  09/06/2024   Colonoscopy  10/24/2024   DTaP/Tdap/Td (3 - Tdap) 08/25/2026   Pneumonia Vaccine 55+ Years old  Completed   Hepatitis C Screening  Completed   Zoster Vaccines- Shingrix  Completed   HPV VACCINES  Aged Out    Health Maintenance  Health Maintenance Due  Topic Date Due   INFLUENZA VACCINE   06/08/2023   COVID-19 Vaccine (3 - 2023-24 season) 07/09/2023    Colorectal cancer screening: Type of screening: Colonoscopy. Completed 08/25/2019. Repeat every 5 years  Lung Cancer Screening: (Low Dose CT Chest recommended if Age 83-80 years, 20 pack-year currently smoking OR have quit w/in 15years.) does not qualify.   Lung Cancer Screening Referral: n/a  Additional Screening:  Hepatitis C Screening: does not qualify; Completed 08/25/2016 Vision Screening: Recommended annual ophthalmology exams for early detection of glaucoma and other disorders of the eye. Is the patient up to date with their annual eye exam?  Yes  Who is the provider or what is the name of the office in which the patient attends annual eye exams? Dr.King  If pt is not established with a provider, would they like to be referred to a provider to establish care?  No .   Dental Screening: Recommended annual dental exams for proper oral hygiene   Community Resource Referral / Chronic Care Management: CRR required this visit?  No   CCM required this visit?  No     Plan:     I have personally reviewed and noted the following in the patient's chart:   Medical and social history Use of alcohol, tobacco or illicit drugs  Current medications and supplements including opioid prescriptions. Patient is not currently taking opioid prescriptions. Functional ability and status Nutritional status Physical activity Advanced directives List of other physicians Hospitalizations, surgeries, and ER visits in previous 12 months Vitals Screenings to include cognitive, depression, and falls Referrals and appointments  In addition, I have reviewed and discussed with patient certain preventive protocols, quality metrics, and best practice recommendations. A written personalized care plan for preventive services as well as general preventive health recommendations were provided to patient.     Lorrene Reid, LPN   [id number]    After Visit Summary: (MyChart) Due to this being a telephonic visit, the after visit summary with patients personalized plan was offered to patient via MyChart   Nurse Notes: none

## 2023-09-07 NOTE — Patient Instructions (Signed)
Bryan Barker , Thank you for taking time to come for your Medicare Wellness Visit. I appreciate your ongoing commitment to your health goals. Please review the following plan we discussed and let me know if I can assist you in the future.   Referrals/Orders/Follow-Ups/Clinician Recommendations: Aim for 30 minutes of exercise or brisk walking, 6-8 glasses of water, and 5 servings of fruits and vegetables each day.   This is a list of the screening recommended for you and due dates:  Health Maintenance  Topic Date Due   Flu Shot  06/08/2023   COVID-19 Vaccine (3 - 2023-24 season) 07/09/2023   Medicare Annual Wellness Visit  09/06/2024   Colon Cancer Screening  10/24/2024   DTaP/Tdap/Td vaccine (3 - Tdap) 08/25/2026   Pneumonia Vaccine  Completed   Hepatitis C Screening  Completed   Zoster (Shingles) Vaccine  Completed   HPV Vaccine  Aged Out    Advanced directives: (In Chart) A copy of your advanced directives are scanned into your chart should your provider ever need it.  Next Medicare Annual Wellness Visit scheduled for next year: Yes  insert Preventive Care attachment Insert FALL PREVENTION attachment if needed

## 2023-09-22 ENCOUNTER — Ambulatory Visit (INDEPENDENT_AMBULATORY_CARE_PROVIDER_SITE_OTHER): Payer: Medicare Other | Admitting: Podiatry

## 2023-09-22 ENCOUNTER — Encounter: Payer: Self-pay | Admitting: Podiatry

## 2023-09-22 DIAGNOSIS — M79674 Pain in right toe(s): Secondary | ICD-10-CM

## 2023-09-22 DIAGNOSIS — B351 Tinea unguium: Secondary | ICD-10-CM

## 2023-09-22 DIAGNOSIS — M79675 Pain in left toe(s): Secondary | ICD-10-CM

## 2023-09-22 NOTE — Progress Notes (Signed)
   Chief Complaint  Patient presents with   Nail Problem    "Toenail cut."    SUBJECTIVE Patient presents to office today complaining of elongated, thickened nails that cause pain while ambulating in shoes.  He is unable to trim his own nails. Patient is here for further evaluation and treatment.  Past Medical History:  Diagnosis Date   Adenomatous colon polyp 2012   Cataract 2019   bilateral eyes   Dehydration 03/2011   ER for syncope attributed to dehydration   Diverticulosis    by colonoscopy   Hemorrhoids    Hyperlipidemia     OBJECTIVE General Patient is awake, alert, and oriented x 3 and in no acute distress. Derm Skin is dry and supple bilateral. Negative open lesions or macerations. Remaining integument unremarkable. Nails are tender, long, thickened and dystrophic with subungual debris, consistent with onychomycosis, 1-5 bilateral. No signs of infection noted. Vasc  DP and PT pedal pulses palpable bilaterally. Temperature gradient within normal limits.  Neuro Epicritic and protective threshold sensation grossly intact bilaterally.  Musculoskeletal Exam No symptomatic pedal deformities noted bilateral. Muscular strength within normal limits.  ASSESSMENT 1. Onychodystrophic nails 1-5 bilateral with hyperkeratosis of nails.  2. Onychomycosis of nail due to dermatophyte bilateral   PLAN OF CARE 1. Patient evaluated today.  2. Instructed to maintain good pedal hygiene and foot care.  3. Mechanical debridement of nails 1-5 bilaterally performed using a nail nipper. Filed with dremel without incident.  4. Return to clinic in 3 mos.   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    Felecia Shelling, DPM [facility]  Dr. Felecia Shelling, DPM    [address] [place], [place] [address]                Office [phone]  Fax [phone]

## 2023-10-01 ENCOUNTER — Other Ambulatory Visit: Payer: Self-pay | Admitting: Family Medicine

## 2023-10-01 DIAGNOSIS — Z125 Encounter for screening for malignant neoplasm of prostate: Secondary | ICD-10-CM

## 2023-10-01 DIAGNOSIS — E7800 Pure hypercholesterolemia, unspecified: Secondary | ICD-10-CM

## 2023-10-01 DIAGNOSIS — K5790 Diverticulosis of intestine, part unspecified, without perforation or abscess without bleeding: Secondary | ICD-10-CM

## 2023-10-01 DIAGNOSIS — R7303 Prediabetes: Secondary | ICD-10-CM

## 2023-10-02 ENCOUNTER — Other Ambulatory Visit (INDEPENDENT_AMBULATORY_CARE_PROVIDER_SITE_OTHER): Payer: Medicare Other

## 2023-10-02 DIAGNOSIS — Z125 Encounter for screening for malignant neoplasm of prostate: Secondary | ICD-10-CM

## 2023-10-02 DIAGNOSIS — R7303 Prediabetes: Secondary | ICD-10-CM | POA: Diagnosis not present

## 2023-10-02 DIAGNOSIS — E7800 Pure hypercholesterolemia, unspecified: Secondary | ICD-10-CM

## 2023-10-02 DIAGNOSIS — K5790 Diverticulosis of intestine, part unspecified, without perforation or abscess without bleeding: Secondary | ICD-10-CM | POA: Diagnosis not present

## 2023-10-02 LAB — CBC WITH DIFFERENTIAL/PLATELET
Basophils Absolute: 0 10*3/uL (ref 0.0–0.1)
Basophils Relative: 0.3 % (ref 0.0–3.0)
Eosinophils Absolute: 0.2 10*3/uL (ref 0.0–0.7)
Eosinophils Relative: 2.6 % (ref 0.0–5.0)
HCT: 41.9 % (ref 39.0–52.0)
Hemoglobin: 14.5 g/dL (ref 13.0–17.0)
Lymphocytes Relative: 30.8 % (ref 12.0–46.0)
Lymphs Abs: 1.9 10*3/uL (ref 0.7–4.0)
MCHC: 34.7 g/dL (ref 30.0–36.0)
MCV: 90.6 fL (ref 78.0–100.0)
Monocytes Absolute: 0.6 10*3/uL (ref 0.1–1.0)
Monocytes Relative: 9.6 % (ref 3.0–12.0)
Neutro Abs: 3.6 10*3/uL (ref 1.4–7.7)
Neutrophils Relative %: 56.7 % (ref 43.0–77.0)
Platelets: 221 10*3/uL (ref 150.0–400.0)
RBC: 4.63 Mil/uL (ref 4.22–5.81)
RDW: 13.4 % (ref 11.5–15.5)
WBC: 6.3 10*3/uL (ref 4.0–10.5)

## 2023-10-02 LAB — LIPID PANEL
Cholesterol: 138 mg/dL (ref 0–200)
HDL: 36.7 mg/dL — ABNORMAL LOW (ref >39.00)
LDL Cholesterol: 86 mg/dL (ref 0–99)
NonHDL: 101.13
Total CHOL/HDL Ratio: 4
Triglycerides: 78 mg/dL (ref 0.0–149.0)
VLDL: 15.6 mg/dL (ref 0.0–40.0)

## 2023-10-02 LAB — COMPREHENSIVE METABOLIC PANEL
ALT: 11 U/L (ref 0–53)
AST: 15 U/L (ref 0–37)
Albumin: 3.8 g/dL (ref 3.5–5.2)
Alkaline Phosphatase: 78 U/L (ref 39–117)
BUN: 10 mg/dL (ref 6–23)
CO2: 30 meq/L (ref 19–32)
Calcium: 9.2 mg/dL (ref 8.4–10.5)
Chloride: 104 meq/L (ref 96–112)
Creatinine, Ser: 1.02 mg/dL (ref 0.40–1.50)
GFR: 72.43 mL/min (ref >60.00)
Glucose, Bld: 99 mg/dL (ref 70–99)
Potassium: 4.3 meq/L (ref 3.5–5.1)
Sodium: 141 meq/L (ref 135–145)
Total Bilirubin: 0.7 mg/dL (ref 0.2–1.2)
Total Protein: 6.3 g/dL (ref 6.0–8.3)

## 2023-10-02 LAB — HEMOGLOBIN A1C: Hgb A1c MFr Bld: 5.8 % (ref 4.6–6.5)

## 2023-10-02 LAB — PSA: PSA: 4.3 ng/mL — ABNORMAL HIGH (ref 0.10–4.00)

## 2023-10-09 ENCOUNTER — Encounter: Payer: Self-pay | Admitting: Family Medicine

## 2023-10-09 ENCOUNTER — Encounter: Payer: Self-pay | Admitting: Ophthalmology

## 2023-10-09 ENCOUNTER — Ambulatory Visit (INDEPENDENT_AMBULATORY_CARE_PROVIDER_SITE_OTHER): Payer: Medicare Other | Admitting: Family Medicine

## 2023-10-09 VITALS — BP 136/66 | HR 71 | Temp 98.1°F | Ht 66.5 in | Wt 157.1 lb

## 2023-10-09 DIAGNOSIS — Z7189 Other specified counseling: Secondary | ICD-10-CM

## 2023-10-09 DIAGNOSIS — Z0000 Encounter for general adult medical examination without abnormal findings: Secondary | ICD-10-CM | POA: Diagnosis not present

## 2023-10-09 DIAGNOSIS — R972 Elevated prostate specific antigen [PSA]: Secondary | ICD-10-CM | POA: Insufficient documentation

## 2023-10-09 DIAGNOSIS — K644 Residual hemorrhoidal skin tags: Secondary | ICD-10-CM

## 2023-10-09 DIAGNOSIS — Z23 Encounter for immunization: Secondary | ICD-10-CM

## 2023-10-09 DIAGNOSIS — R7303 Prediabetes: Secondary | ICD-10-CM

## 2023-10-09 DIAGNOSIS — E7800 Pure hypercholesterolemia, unspecified: Secondary | ICD-10-CM

## 2023-10-09 MED ORDER — PRAVASTATIN SODIUM 40 MG PO TABS: 40.0000 mg | ORAL_TABLET | Freq: Every day | ORAL | 4 refills | Status: DC

## 2023-10-09 NOTE — Patient Instructions (Addendum)
Flu shot today  Good to see you today Schedule lab visit in 3 months to recheck prostate levels.  Return in 1 year for next physical.

## 2023-10-09 NOTE — Assessment & Plan Note (Signed)
Encouraged limiting added sugar/carbs.

## 2023-10-09 NOTE — Assessment & Plan Note (Signed)
Preventative protocols reviewed and updated unless pt declined. Discussed healthy diet and lifestyle.  

## 2023-10-09 NOTE — Assessment & Plan Note (Addendum)
Advanced directives: scanned in chart 2024. Oldest daughter Royden Purl is HCPOA followed by Maryfrances Bunnell. Full code, but would not want prolonged life support if terminal condition. Doesn't think would want intubation.

## 2023-10-09 NOTE — Assessment & Plan Note (Signed)
Elevated today, pt remains asxs.  Reassuring DRE today.  Will return in 3 months to recheck PSA. If persistently elevated, low threshold to refer to urology.  He notes brother recently found to have elevated PSA and is seeing urologist next month.

## 2023-10-09 NOTE — Assessment & Plan Note (Signed)
Stable with daily bowel regimen.

## 2023-10-09 NOTE — Progress Notes (Signed)
Ph: [phone] Fax: [phone]   Patient ID: Bryan Barker, male    DOB: July 15, 1951, 71 y.o.   MRN: [id number]  This visit was conducted in person.  BP 136/66 (BP Location: Right Arm, Cuff Size: Normal)   Pulse 71   Temp 98.1 F (36.7 C) (Oral)   Ht 5' 6.5" (1.689 m)   Wt 157 lb 2 oz (71.3 kg)   SpO2 99%   BMI 24.98 kg/m    CC: CPE Subjective:   HPI: Bryan Barker is a 72 y.o. male presenting on 10/09/2023 for Medicare Wellness   Saw health advisor 08/2023 for medicare wellness visit. Note reviewed.   Hearing Screening   500Hz  1000Hz  2000Hz  4000Hz   Right ear 25 40 40 0  Left ear 0 40 40 0  Vision Screening - Comments:: Last eye exam, 07/2023.  Flowsheet Row Office Visit from 10/09/2023 in [facility]  PHQ-2 Total Score 0     Denies hearing difficulty     10/09/2023    8:16 AM 09/07/2023    9:02 AM 03/30/2023    3:22 PM 10/03/2022   12:03 PM 09/28/2021    1:18 PM  Fall Risk   Falls in the past year? 0 0 0 0 0  Number falls in past yr:  0 0 0 0  Injury with Fall?  0 0 0 0  Risk for fall due to :  No Fall Risks No Fall Risks  No Fall Risks  Follow up  Falls prevention discussed Falls evaluation completed Falls evaluation completed;Education provided;Falls prevention discussed Falls prevention discussed   Home BP readings have been elevated to 150s (checks with sister's cuff)  Preventative: COLONOSCOPY Date: 10/2014 sig diverticulosis, rpt 5 yrs Bryan Barker) COLONOSCOPY 10/2019 - 2 TA, diverticulosis, rpt 5 yrs Bryan Barker) Prostate screening - yearly PSA. No nocturia. Strong stream.  Lung cancer screening - not eligible  Flu shot yearly  COVID vaccine Pfizer 12/2019, 01/2020  Prevnar-13 08/2014, pneumovax 2016 Tetanus 08/2016 Zostavax - 06/2012  Shingrix - 09/2019, 02/2020 Advanced directives: scanned in chart 2024. Oldest daughter Bryan Barker is HCPOA followed by Bryan Barker. Full code, but would not want prolonged life  support if terminal condition. Doesn't think would want intubation.  Seat belt use discussed.  Sunscreen use discussed. No changing moles on skin.  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 Interim medical history since our last visit reviewed. Allergies and medications reviewed and updated. Outpatient Medications Prior to Visit  Medication Sig Dispense Refill   aspirin 81 MG EC tablet Take 1 tablet (81 mg total) by mouth every Monday, Wednesday, and Friday. Swallow whole.     docusate sodium (COLACE) 100 MG capsule Take 100 mg by mouth daily.     Psyllium (METAMUCIL PO) Take by mouth daily.     pravastatin (PRAVACHOL) 40 MG tablet Take 1 tablet (40 mg  total) by mouth daily. 90 tablet 4   No facility-administered medications prior to visit.     Per HPI unless specifically indicated in ROS section below Review of Systems  Constitutional:  Negative for activity change, appetite change, chills, fatigue, fever and unexpected weight change.  HENT:  Negative for hearing loss.   Eyes:  Negative for visual disturbance.  Respiratory:  Negative for cough, chest tightness, shortness of breath and wheezing.   Cardiovascular:  Negative for chest pain, palpitations and leg swelling.  Gastrointestinal:  Negative for abdominal distention, abdominal pain, blood in stool, constipation, diarrhea, nausea and  vomiting.  Genitourinary:  Negative for difficulty urinating and hematuria.  Musculoskeletal:  Negative for arthralgias, myalgias and neck pain.  Skin:  Negative for rash.  Neurological:  Negative for dizziness, seizures, syncope and headaches.  Hematological:  Negative for adenopathy. Does not bruise/bleed easily.  Psychiatric/Behavioral:  Negative for dysphoric mood. The patient is not nervous/anxious.     Objective:  BP 136/66 (BP Location: Right Arm, Cuff Size: Normal)   Pulse 71   Temp 98.1 F (36.7 C) (Oral)   Ht 5' 6.5" (1.689 m)   Wt 157 lb 2 oz (71.3 kg)   SpO2 99%   BMI 24.98 kg/m   Wt Readings from Last 3 Encounters:  10/09/23 157 lb 2 oz (71.3 kg)  09/07/23 165 lb (74.8 kg)  03/30/23 160 lb 4 oz (72.7 kg)      Physical Exam Vitals and nursing note reviewed.  Constitutional:      General: He is not in acute distress.    Appearance: Normal appearance. He is well-developed. He is not ill-appearing.  HENT:     Head: Normocephalic and atraumatic.     Right Ear: Hearing, tympanic membrane, ear canal and external ear normal.     Left Ear: Hearing, tympanic membrane, ear canal and external ear normal.     Nose: Nose normal.     Mouth/Throat:     Mouth: Mucous membranes are moist.     Pharynx: Oropharynx is clear. No oropharyngeal exudate or posterior oropharyngeal erythema.  Eyes:     General: No scleral icterus.    Extraocular Movements: Extraocular movements intact.     Conjunctiva/sclera: Conjunctivae normal.     Pupils: Pupils are equal, round, and reactive to light.  Neck:     Thyroid: No thyroid mass or thyromegaly.     Vascular: No carotid bruit.  Cardiovascular:     Rate and Rhythm: Normal rate and regular rhythm.     Pulses: Normal pulses.          Radial pulses are 2+ on the right side and 2+ on the left side.     Heart sounds: Normal heart sounds. No murmur heard. Pulmonary:     Effort: Pulmonary effort is normal. No respiratory distress.     Breath  sounds: Normal breath sounds. No wheezing, rhonchi or rales.  Abdominal:     General: Bowel sounds are normal. There is no distension.     Palpations: Abdomen is soft. There is no mass.     Tenderness: There is no abdominal tenderness. There is no guarding or rebound.     Hernia: No hernia is present.  Genitourinary:    Prostate: Normal. Not enlarged, not tender and no nodules present.     Rectum: Internal hemorrhoid (mild prolapse) present. No mass, tenderness, anal fissure or external hemorrhoid. Normal anal tone.  Musculoskeletal:        General: Normal  range of motion.     Cervical back: Normal range of motion and neck supple.     Right lower leg: No edema.     Left lower leg: No edema.  Lymphadenopathy:     Cervical: No cervical adenopathy.  Skin:    General: Skin is warm and dry.     Findings: No rash.  Neurological:     General: No focal deficit present.     Mental Status: He is alert and oriented to person, place, and time.  Psychiatric:        Mood and Affect: Mood normal.        Behavior: Behavior normal.        Thought Content: Thought content normal.        Judgment: Judgment normal.       Results for orders placed or performed in visit on 10/02/23  CBC with Differential/Platelet  Result Value Ref Range   WBC 6.3 4.0 - 10.5 K/uL   RBC 4.63 4.22 - 5.81 Mil/uL   Hemoglobin 14.5 13.0 - 17.0 g/dL   HCT 13.0 86.5 - 78.4 %   MCV 90.6 78.0 - 100.0 fl   MCHC 34.7 30.0 - 36.0 g/dL   RDW 69.6 29.5 - 28.4 %   Platelets 221.0 150.0 - 400.0 K/uL   Neutrophils Relative % 56.7 43.0 - 77.0 %   Lymphocytes Relative 30.8 12.0 - 46.0 %   Monocytes Relative 9.6 3.0 - 12.0 %   Eosinophils Relative 2.6 0.0 - 5.0 %   Basophils Relative 0.3 0.0 - 3.0 %   Neutro Abs 3.6 1.4 - 7.7 K/uL   Lymphs Abs 1.9 0.7 - 4.0 K/uL   Monocytes Absolute 0.6 0.1 - 1.0 K/uL   Eosinophils Absolute 0.2 0.0 - 0.7 K/uL   Basophils Absolute 0.0 0.0 - 0.1 K/uL  PSA  Result Value Ref Range   PSA 4.30  (H) 0.10 - 4.00 ng/mL  Hemoglobin A1c  Result Value Ref Range   Hgb A1c MFr Bld 5.8 4.6 - 6.5 %  Comprehensive metabolic panel  Result Value Ref Range   Sodium 141 135 - 145 mEq/L   Potassium 4.3 3.5 - 5.1 mEq/L   Chloride 104 96 - 112 mEq/L   CO2 30 19 - 32 mEq/L   Glucose, Bld 99 70 - 99 mg/dL   BUN 10 6 - 23 mg/dL   Creatinine, Ser 1.32 0.40 - 1.50 mg/dL   Total Bilirubin 0.7 0.2 - 1.2 mg/dL   Alkaline Phosphatase 78 39 - 117 U/L   AST 15 0 - 37 U/L   ALT 11 0 - 53 U/L   Total Protein 6.3 6.0 - 8.3 g/dL   Albumin 3.8 3.5 - 5.2 g/dL   GFR 44.01 >02.72 mL/min   Calcium 9.2 8.4 - 10.5 mg/dL  Lipid panel  Result Value Ref Range   Cholesterol 138 0 - 200 mg/dL   Triglycerides 53.6 0.0 - 149.0 mg/dL   HDL 64.40 (L) >34.74 mg/dL   VLDL 25.9 0.0 - 56.3 mg/dL   LDL Cholesterol 86 0 - 99 mg/dL   Total CHOL/HDL Ratio 4    NonHDL 101.13     Assessment & Plan:   Problem List Items Addressed This Visit     Healthcare maintenance - Primary (Chronic)    Preventative protocols reviewed and updated unless pt declined. Discussed healthy diet and lifestyle.       Advanced care planning/counseling discussion (Chronic)    Advanced directives: scanned in  chart 2024. Oldest daughter Bryan Barker is HCPOA followed by Bryan Barker. Full code, but would not want prolonged life support if terminal condition. Doesn't think would want intubation.       Hypercholesteremia    Chronic, stable on pravastatin - continue. The 10-year ASCVD risk score (Arnett DK, et al., 2019) is: 25%   Values used to calculate the score:     Age: 32 years     Sex: Male     Is Non-Hispanic African American: No     Diabetic: No     Tobacco smoker: No     Systolic Blood Pressure: 136 mmHg     Is BP treated: No     HDL Cholesterol: 36.7 mg/dL     Total Cholesterol: 138 mg/dL       Relevant Medications   pravastatin (PRAVACHOL) 40 MG tablet   External hemorrhoids    Stable with daily bowel regimen.        Relevant Medications   pravastatin (PRAVACHOL) 40 MG tablet   Prediabetes    Encouraged limiting added sugar/carbs.       Elevated PSA    Elevated today, pt remains asxs.  Reassuring DRE today.  Will return in 3 months to recheck PSA. If persistently elevated, low threshold to refer to urology.  He notes brother recently found to have elevated PSA and is seeing urologist next month.      Relevant Orders   PSA, total and free   Other Visit Diagnoses     Encounter for immunization       Relevant Orders   Flu Vaccine Trivalent High Dose (Fluad) (Completed)        Meds ordered this encounter  Medications   pravastatin (PRAVACHOL) 40 MG tablet    Sig: Take 1 tablet (40 mg total) by mouth daily.    Dispense:  90 tablet    Refill:  4    Orders Placed This Encounter  Procedures   Flu Vaccine Trivalent High Dose (Fluad)   PSA, total and free    Standing Status:   Future    Standing Expiration Date:   10/08/2024    Patient Instructions  Flu shot today  Good to see you today Schedule lab visit in 3 months to recheck prostate levels.  Return in 1 year for next physical.    Follow up plan: Return in about 1 year (around 10/08/2024) for annual exam, prior fasting for blood work.  Eustaquio Boyden, MD

## 2023-10-09 NOTE — Assessment & Plan Note (Addendum)
Chronic, stable on pravastatin - continue. The 10-year ASCVD risk score (Arnett DK, et al., 2019) is: 25%   Values used to calculate the score:     Age: 72 years     Sex: Male     Is Non-Hispanic African American: No     Diabetic: No     Tobacco smoker: No     Systolic Blood Pressure: 136 mmHg     Is BP treated: No     HDL Cholesterol: 36.7 mg/dL     Total Cholesterol: 138 mg/dL

## 2023-10-12 NOTE — Discharge Instructions (Signed)

## 2023-10-16 ENCOUNTER — Ambulatory Visit: Payer: Medicare Other | Admitting: Anesthesiology

## 2023-10-16 ENCOUNTER — Encounter
Admission: RE | Disposition: A | Discharge status: Home / Self Care | Payer: Self-pay | Source: Home / Self Care | Attending: Ophthalmology

## 2023-10-16 ENCOUNTER — Encounter: Payer: Self-pay | Admitting: Ophthalmology

## 2023-10-16 ENCOUNTER — Ambulatory Visit
Admission: RE | Admit: 2023-10-16 | Discharge: 2023-10-16 | Disposition: A | Discharge status: Home / Self Care | Payer: Medicare Other | Attending: Ophthalmology | Admitting: Ophthalmology

## 2023-10-16 ENCOUNTER — Other Ambulatory Visit: Payer: Self-pay

## 2023-10-16 DIAGNOSIS — Z87891 Personal history of nicotine dependence: Secondary | ICD-10-CM | POA: Diagnosis not present

## 2023-10-16 DIAGNOSIS — H2511 Age-related nuclear cataract, right eye: Secondary | ICD-10-CM | POA: Insufficient documentation

## 2023-10-16 HISTORY — PX: CATARACT EXTRACTION W/PHACO: SHX586

## 2023-10-16 HISTORY — DX: Unspecified osteoarthritis, unspecified site: M19.90

## 2023-10-16 SURGERY — PHACOEMULSIFICATION, CATARACT, WITH IOL INSERTION
Anesthesia: Monitor Anesthesia Care | Site: Eye | Laterality: Right

## 2023-10-16 MED ORDER — FENTANYL CITRATE (PF) 100 MCG/2ML IJ SOLN
INTRAMUSCULAR | Status: AC
  Filled 2023-10-16: qty 2

## 2023-10-16 MED ORDER — MIDAZOLAM HCL 2 MG/2ML IJ SOLN
INTRAMUSCULAR | Status: AC
  Filled 2023-10-16: qty 2

## 2023-10-16 MED ORDER — SIGHTPATH DOSE#1 BSS IO SOLN
INTRAOCULAR | Status: DC | PRN
  Administered 2023-10-16: 15 mL via INTRAOCULAR

## 2023-10-16 MED ORDER — MIDAZOLAM HCL 2 MG/2ML IJ SOLN
INTRAMUSCULAR | Status: DC | PRN
  Administered 2023-10-16: 2 mg via INTRAVENOUS

## 2023-10-16 MED ORDER — TETRACAINE HCL 0.5 % OP SOLN
OPHTHALMIC | Status: AC
  Filled 2023-10-16: qty 4

## 2023-10-16 MED ORDER — MOXIFLOXACIN HCL 0.5 % OP SOLN
OPHTHALMIC | Status: DC | PRN
  Administered 2023-10-16: .2 mL via OPHTHALMIC

## 2023-10-16 MED ORDER — FENTANYL CITRATE (PF) 100 MCG/2ML IJ SOLN
INTRAMUSCULAR | Status: DC | PRN
  Administered 2023-10-16: 100 ug via INTRAVENOUS

## 2023-10-16 MED ORDER — LIDOCAINE HCL (PF) 2 % IJ SOLN
INTRAOCULAR | Status: DC | PRN
  Administered 2023-10-16: 1 mL via INTRAOCULAR

## 2023-10-16 MED ORDER — SIGHTPATH DOSE#1 BSS IO SOLN
INTRAOCULAR | Status: DC | PRN
  Administered 2023-10-16: 70 mL via OPHTHALMIC

## 2023-10-16 MED ORDER — GLYCOPYRROLATE 0.2 MG/ML IJ SOLN
INTRAMUSCULAR | Status: AC
  Filled 2023-10-16: qty 1

## 2023-10-16 MED ORDER — ARMC OPHTHALMIC DILATING DROPS
1.0000 | OPHTHALMIC | Status: DC | PRN
  Administered 2023-10-16 (×3): 1 via OPHTHALMIC

## 2023-10-16 MED ORDER — SIGHTPATH DOSE#1 NA HYALUR & NA CHOND-NA HYALUR IO KIT
PACK | INTRAOCULAR | Status: DC | PRN
  Administered 2023-10-16: 1 via OPHTHALMIC

## 2023-10-16 MED ORDER — TETRACAINE HCL 0.5 % OP SOLN
1.0000 [drp] | OPHTHALMIC | Status: DC | PRN
  Administered 2023-10-16 (×3): 1 [drp] via OPHTHALMIC

## 2023-10-16 SURGICAL SUPPLY — 10 items
CATARACT SUITE SIGHTPATH (MISCELLANEOUS) ×1
DISSECTOR HYDRO NUCLEUS 50X22 (MISCELLANEOUS) ×1 IMPLANT
FEE CATARACT SUITE SIGHTPATH (MISCELLANEOUS) ×1 IMPLANT
GLOVE PI ULTRA LF STRL 7.5 (GLOVE) ×1 IMPLANT
GLOVE SURG POLYISOPRENE 8.5 (GLOVE) ×1
GLOVE SURG SYN 8.5 PF PI BL (GLOVE) ×1 IMPLANT
LENS IOL TECNIS EYHANCE 20.0 (Intraocular Lens) IMPLANT
NDL FILTER BLUNT 18X1 1/2 (NEEDLE) ×1 IMPLANT
NEEDLE FILTER BLUNT 18X1 1/2 (NEEDLE) ×1
SYR 3ML LL SCALE MARK (SYRINGE) ×1 IMPLANT

## 2023-10-16 NOTE — Anesthesia Preprocedure Evaluation (Addendum)
Anesthesia Evaluation  Patient identified by MRN, date of birth, ID band Patient awake    Reviewed: Allergy & Precautions, H&P , NPO status , Patient's Chart, lab work & pertinent test results  Airway Mallampati: II  TM Distance: >3 FB Neck ROM: Full    Dental no notable dental hx.    Pulmonary former smoker   Pulmonary exam normal breath sounds clear to auscultation       Cardiovascular negative cardio ROS Normal cardiovascular exam Rhythm:Regular Rate:Normal     Neuro/Psych negative neurological ROS  negative psych ROS   GI/Hepatic negative GI ROS, Neg liver ROS,,,  Endo/Other  negative endocrine ROS    Renal/GU negative Renal ROS  negative genitourinary   Musculoskeletal negative musculoskeletal ROS (+) Arthritis ,    Abdominal   Peds negative pediatric ROS (+)  Hematology negative hematology ROS (+)   Anesthesia Other Findings Dehydration Hyperlipidemia Diverticulosis Cataract Hemorrhoids Adenomatous colon polyp Arthritis   Had vasovagal response to IV placement preop, spontaneous recovery with supine position   Reproductive/Obstetrics negative OB ROS                             Anesthesia Physical Anesthesia Plan  ASA: 2  Anesthesia Plan: MAC   Post-op Pain Management:    Induction: Intravenous  PONV Risk Score and Plan:   Airway Management Planned: Natural Airway and Nasal Cannula  Additional Equipment:   Intra-op Plan:   Post-operative Plan:   Informed Consent: I have reviewed the patients History and Physical, chart, labs and discussed the procedure including the risks, benefits and alternatives for the proposed anesthesia with the patient or authorized representative who has indicated his/her understanding and acceptance.     Dental Advisory Given  Plan Discussed with: Anesthesiologist, CRNA and Surgeon  Anesthesia Plan Comments: (Patient consented for  risks of anesthesia including but not limited to:  - adverse reactions to medications - damage to eyes, teeth, lips or other oral mucosa - nerve damage due to positioning  - sore throat or hoarseness - Damage to heart, brain, nerves, lungs, other parts of body or loss of life  Patient voiced understanding and assent.)       Anesthesia Quick Evaluation

## 2023-10-16 NOTE — H&P (Signed)
[facility]   Primary Care Physician:  Eustaquio Boyden, MD Ophthalmologist: Dr. Willey Blade  Pre-Procedure History & Physical: HPI:  Bryan Barker is a 72 y.o. male here for cataract surgery.   Past Medical History:  Diagnosis Date   Adenomatous colon polyp 2012   Arthritis    fingers   Cataract 2019   bilateral eyes   Dehydration 03/2011   ER for syncope attributed to dehydration   Diverticulosis    by colonoscopy   Hemorrhoids    Hyperlipidemia     Past Surgical History:  Procedure Laterality Date   COLONOSCOPY  06/30/11   2012: 4 polyps (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), diverticulosis   COLONOSCOPY  10/2014   sig diverticulosis, rpt 5 yrs Leone Payor)   COLONOSCOPY  10/2019   2 TA, diverticulosis, rpt 5 yrs Leone Payor)   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 Authorizing Provider  aspirin 81 MG EC tablet Take 1 tablet (81 mg total) by mouth every Monday, Wednesday, and Friday. Swallow whole. 10/04/21  Yes Eustaquio Boyden, MD  docusate sodium (COLACE) 100 MG capsule Take 100 mg by mouth daily.   Yes [provider]  pravastatin (PRAVACHOL) 40 MG tablet Take 1 tablet (40 mg total) by mouth daily. 10/09/23  Yes Eustaquio Boyden, MD  Psyllium (METAMUCIL PO) Take by mouth daily.   Yes [provider]    Allergies as of 10/02/2023   (No Known Allergies)    Family History  Problem Relation Age of Onset   Diabetes Mother    Hypertension Mother    Heart disease Mother        Pacer   Colon polyps Mother    Coronary artery disease Father 25       MI   Cancer Brother        esoph, mets to brain   Diabetes Brother    Alcohol abuse Sister    Colon cancer Neg Hx    Stroke Neg Hx    Esophageal cancer Neg Hx    Stomach cancer Neg Hx    Rectal cancer Neg Hx     Social History   Socioeconomic History    Marital status: Married    Spouse name: Not on file   Number of children: 2   Years of education: Not on file   Highest education level: Not on file  Occupational History   Occupation: retired    Comment: retiring 2/11  Tobacco Use   Smoking status: Former    Current packs/day: 0.00    Average packs/day: 1 pack/day for 3.0 years (3.0 ttl pk-yrs)    Types: Cigarettes    Start date: 06/16/1965    Quit date: 06/16/1968    Years since quitting: 55.3   Smokeless tobacco: Never   Tobacco comments:    only smoked as a teenager  Vaping Use   Vaping status: Never Used  Substance and Sexual Activity   Alcohol use: No    Alcohol/week: 0.0 standard drinks of alcohol   Drug use: No   Sexual activity: Yes  Other Topics Concern   Not on file  Social History Narrative   Caffeine: 1 cup/day   Married and lives with wife, 1 dog, cows   2 daughters   Occupation: retired, 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, 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   Diet: good water, fruits/vegetables daily   Social Determinants of Health   Financial Resource Strain: Low Risk  (09/07/2023)   Overall Financial Resource Strain (CARDIA)    Difficulty of Paying Living Expenses: Not hard at all  Food Insecurity: No Food Insecurity (09/07/2023)   Hunger Vital Sign    Worried About Running Out of Food in the Last Year: Never true    Ran Out of Food in the Last Year: Never true  Transportation Needs: No Transportation Needs (09/07/2023)   PRAPARE - Administrator, Civil Service (Medical): No    Lack of Transportation (Non-Medical): No  Physical Activity: Sufficiently Active (09/07/2023)   Exercise Vital Sign    Days of Exercise per Week: 5 days    Minutes of Exercise per Session: 30 min  Stress: No Stress Concern Present (09/07/2023)   [employer] of Occupational Health - Occupational Stress Questionnaire    Feeling of Stress : Not at all  Social Connections: Socially Integrated  (09/07/2023)   Social Connection and Isolation Panel [NHANES]    Frequency of Communication with Friends and Family: More than three times a week    Frequency of Social Gatherings with Friends and Family: More than three times a week    Attends Religious Services: More than 4 times per year    Active Member of [employer] or Organizations: Yes    Attends Engineer, structural: More than 4 times per year    Marital Status: Married  Catering manager Violence: Not At Risk (09/07/2023)   Humiliation, Afraid, Rape, and Kick questionnaire    Fear of Current or Ex-Partner: No    Emotionally Abused: No    Physically Abused: No    Sexually Abused: No    Review of Systems: See HPI, otherwise negative ROS  Physical Exam: BP (!) 141/75   Temp (!) 97.3 F (36.3 C) (Temporal)   Resp 12   Ht 5' 6.5" (1.689 m)   Wt 71.2 kg   SpO2 98%   BMI 24.96 kg/m  General:   Alert, cooperative in NAD Head:  Normocephalic and atraumatic. Respiratory:  Normal work of breathing. Cardiovascular:  RRR  Impression/Plan: Bryan Barker is here for cataract surgery.  Risks, benefits, limitations, and alternatives regarding cataract surgery have been reviewed with the patient.  Questions have been answered.  All parties agreeable.   Willey Blade, MD  10/16/2023, 9:00 AM

## 2023-10-16 NOTE — Anesthesia Postprocedure Evaluation (Signed)
Anesthesia Post Note  Patient: Bryan Barker  Procedure(s) Performed: CATARACT EXTRACTION PHACO AND INTRAOCULAR LENS PLACEMENT (IOC) RIGHT 3.38 00:26.2 (Right: Eye)  Patient location during evaluation: PACU Anesthesia Type: MAC Level of consciousness: awake and alert Pain management: pain level controlled Vital Signs Assessment: post-procedure vital signs reviewed and stable Respiratory status: spontaneous breathing, nonlabored ventilation, respiratory function stable and patient connected to nasal cannula oxygen Cardiovascular status: stable and blood pressure returned to baseline Postop Assessment: no apparent nausea or vomiting Anesthetic complications: no   No notable events documented.   Last Vitals:  Vitals:   10/16/23 0931 10/16/23 0935  BP: 114/74 120/68  Pulse: 67 73  Resp: (!) 9 (!) 26  Temp: (!) 36.3 C (!) 36.3 C  SpO2: 98% 97%    Last Pain:  Vitals:   10/16/23 0935  TempSrc:   PainSc: 0-No pain                 Creig Landin C Cher Franzoni

## 2023-10-16 NOTE — Op Note (Signed)
OPERATIVE NOTE  THANOS CAPAN [id number] 10/16/2023   PREOPERATIVE DIAGNOSIS:  Nuclear sclerotic cataract right eye.  H25.11   POSTOPERATIVE DIAGNOSIS:    Nuclear sclerotic cataract right eye.     PROCEDURE:  Phacoemusification with posterior chamber intraocular lens placement of the right eye   LENS:   Implant Name Type Inv. Item Serial No. Manufacturer Lot No. LRB No. Used Action  LENS IOL TECNIS EYHANCE 20.0 - [id number] Intraocular Lens LENS IOL TECNIS EYHANCE 20.0 [id number] SIGHTPATH  Right 1 Implanted       Procedure(s): CATARACT EXTRACTION PHACO AND INTRAOCULAR LENS PLACEMENT (IOC) RIGHT 3.38 00:26.2 (Right)  SURGEON:  Willey Blade, MD, MPH  ANESTHESIOLOGIST: Anesthesiologist: Marisue Humble, MD CRNA: Domenic Moras, CRNA   ANESTHESIA:  Topical with tetracaine drops augmented with 1% preservative-free intracameral lidocaine.  ESTIMATED BLOOD LOSS: less than 1 mL.   COMPLICATIONS:  None.   DESCRIPTION OF PROCEDURE:  The patient was identified in the holding room and transported to the operating room and placed in the supine position under the operating microscope.  The right eye was identified as the operative eye and it was prepped and draped in the usual sterile ophthalmic fashion.   A 1.0 millimeter clear-corneal paracentesis was made at the 10:30 position. 0.5 ml of preservative-free 1% lidocaine with epinephrine was injected into the anterior chamber.  The anterior chamber was filled with viscoelastic.  A 2.4 millimeter keratome was used to make a near-clear corneal incision at the 8:00 position.  A curvilinear capsulorrhexis was made with a cystotome and capsulorrhexis forceps.  Balanced salt solution was used to hydrodissect and hydrodelineate the nucleus.   Phacoemulsification was then used in stop and chop fashion to remove the lens nucleus and epinucleus.  The remaining cortex was then removed using the irrigation and aspiration handpiece. Viscoelastic was  then placed into the capsular bag to distend it for lens placement.  A lens was then injected into the capsular bag.  The remaining viscoelastic was aspirated.   Wounds were hydrated with balanced salt solution.  The anterior chamber was inflated to a physiologic pressure with balanced salt solution.    Intracameral vigamox 0.1 mL undiluted was injected into the eye and a drop placed onto the ocular surface.  No wound leaks were noted.  The patient was taken to the recovery room in stable condition without complications of anesthesia or surgery  Willey Blade 10/16/2023, 9:30 AM

## 2023-10-16 NOTE — Transfer of Care (Signed)
Immediate Anesthesia Transfer of Care Note  Patient: Bryan Barker  Procedure(s) Performed: CATARACT EXTRACTION PHACO AND INTRAOCULAR LENS PLACEMENT (IOC) RIGHT 3.38 00:26.2 (Right: Eye)  Patient Location: PACU  Anesthesia Type: MAC  Level of Consciousness: awake, alert  and patient cooperative  Airway and Oxygen Therapy: Patient Spontanous Breathing and Patient connected to supplemental oxygen  Post-op Assessment: Post-op Vital signs reviewed, Patient's Cardiovascular Status Stable, Respiratory Function Stable, Patent Airway and No signs of Nausea or vomiting  Post-op Vital Signs: Reviewed and stable  Complications: No notable events documented.

## 2023-10-17 ENCOUNTER — Encounter: Payer: Self-pay | Admitting: Ophthalmology

## 2023-10-18 NOTE — Anesthesia Preprocedure Evaluation (Addendum)
Anesthesia Evaluation  Patient identified by MRN, date of birth, ID band Patient awake    Reviewed: Allergy & Precautions, H&P , NPO status , Patient's Chart, lab work & pertinent test results  Airway Mallampati: II  TM Distance: >3 FB Neck ROM: Full    Dental no notable dental hx.    Pulmonary neg pulmonary ROS, former smoker   Pulmonary exam normal breath sounds clear to auscultation       Cardiovascular negative cardio ROS Normal cardiovascular exam Rhythm:Regular Rate:Normal     Neuro/Psych negative neurological ROS  negative psych ROS   GI/Hepatic negative GI ROS, Neg liver ROS,,,  Endo/Other  negative endocrine ROS    Renal/GU negative Renal ROS  negative genitourinary   Musculoskeletal negative musculoskeletal ROS (+) Arthritis ,    Abdominal   Peds negative pediatric ROS (+)  Hematology negative hematology ROS (+)   Anesthesia Other Findings Previous cataract surgery 10-16-23 Dr. Juel Burrow anesthesiologist  Dehydration  Hyperlipidemia Diverticulosis  Cataract Hemorrhoids  Adenomatous colon polyp Arthritis     Reproductive/Obstetrics negative OB ROS                             Anesthesia Physical Anesthesia Plan  ASA: 2  Anesthesia Plan: MAC   Post-op Pain Management:    Induction: Intravenous  PONV Risk Score and Plan:   Airway Management Planned: Natural Airway and Nasal Cannula  Additional Equipment:   Intra-op Plan:   Post-operative Plan:   Informed Consent: I have reviewed the patients History and Physical, chart, labs and discussed the procedure including the risks, benefits and alternatives for the proposed anesthesia with the patient or authorized representative who has indicated his/her understanding and acceptance.     Dental Advisory Given  Plan Discussed with: Anesthesiologist, CRNA and Surgeon  Anesthesia Plan Comments: (Patient consented for  risks of anesthesia including but not limited to:  - adverse reactions to medications - damage to eyes, teeth, lips or other oral mucosa - nerve damage due to positioning  - sore throat or hoarseness - Damage to heart, brain, nerves, lungs, other parts of body or loss of life  Patient voiced understanding and assent.)       Anesthesia Quick Evaluation

## 2023-10-19 NOTE — Discharge Instructions (Signed)

## 2023-10-23 ENCOUNTER — Ambulatory Visit: Payer: Medicare Other | Admitting: Anesthesiology

## 2023-10-23 ENCOUNTER — Other Ambulatory Visit: Payer: Self-pay

## 2023-10-23 ENCOUNTER — Encounter
Admission: RE | Disposition: A | Discharge status: Home / Self Care | Payer: Self-pay | Source: Home / Self Care | Attending: Ophthalmology

## 2023-10-23 ENCOUNTER — Ambulatory Visit
Admission: RE | Admit: 2023-10-23 | Discharge: 2023-10-23 | Disposition: A | Discharge status: Home / Self Care | Payer: Medicare Other | Attending: Ophthalmology | Admitting: Ophthalmology

## 2023-10-23 ENCOUNTER — Encounter: Payer: Self-pay | Admitting: Ophthalmology

## 2023-10-23 DIAGNOSIS — M1990 Unspecified osteoarthritis, unspecified site: Secondary | ICD-10-CM | POA: Insufficient documentation

## 2023-10-23 DIAGNOSIS — Z87891 Personal history of nicotine dependence: Secondary | ICD-10-CM | POA: Diagnosis not present

## 2023-10-23 DIAGNOSIS — H2512 Age-related nuclear cataract, left eye: Secondary | ICD-10-CM | POA: Insufficient documentation

## 2023-10-23 HISTORY — PX: CATARACT EXTRACTION W/PHACO: SHX586

## 2023-10-23 SURGERY — PHACOEMULSIFICATION, CATARACT, WITH IOL INSERTION
Anesthesia: Monitor Anesthesia Care | Site: Eye | Laterality: Left

## 2023-10-23 MED ORDER — ARMC OPHTHALMIC DILATING DROPS
1.0000 | OPHTHALMIC | Status: DC | PRN
Start: 2023-10-23 — End: 2023-10-23
  Administered 2023-10-23 (×3): 1 via OPHTHALMIC

## 2023-10-23 MED ORDER — MIDAZOLAM HCL 2 MG/2ML IJ SOLN
INTRAMUSCULAR | Status: DC | PRN
  Administered 2023-10-23: 2 mg via INTRAVENOUS

## 2023-10-23 MED ORDER — SIGHTPATH DOSE#1 BSS IO SOLN
INTRAOCULAR | Status: DC | PRN
  Administered 2023-10-23: 15 mL

## 2023-10-23 MED ORDER — TETRACAINE HCL 0.5 % OP SOLN
1.0000 [drp] | OPHTHALMIC | Status: DC | PRN
  Administered 2023-10-23 (×3): 1 [drp] via OPHTHALMIC

## 2023-10-23 MED ORDER — FENTANYL CITRATE (PF) 100 MCG/2ML IJ SOLN
INTRAMUSCULAR | Status: AC
  Filled 2023-10-23: qty 2

## 2023-10-23 MED ORDER — SIGHTPATH DOSE#1 BSS IO SOLN
INTRAOCULAR | Status: DC | PRN
  Administered 2023-10-23: 115 mL via OPHTHALMIC

## 2023-10-23 MED ORDER — MOXIFLOXACIN HCL 0.5 % OP SOLN
OPHTHALMIC | Status: DC | PRN
  Administered 2023-10-23: .2 mL via OPHTHALMIC

## 2023-10-23 MED ORDER — LIDOCAINE HCL (PF) 2 % IJ SOLN
INTRAOCULAR | Status: DC | PRN
  Administered 2023-10-23: 1 mL via INTRAOCULAR

## 2023-10-23 MED ORDER — TETRACAINE HCL 0.5 % OP SOLN
OPHTHALMIC | Status: AC
Start: 2023-10-23 — End: ?
  Filled 2023-10-23: qty 4

## 2023-10-23 MED ORDER — MIDAZOLAM HCL 2 MG/2ML IJ SOLN
INTRAMUSCULAR | Status: AC
Start: 2023-10-23 — End: ?
  Filled 2023-10-23: qty 2

## 2023-10-23 MED ORDER — SIGHTPATH DOSE#1 NA HYALUR & NA CHOND-NA HYALUR IO KIT
PACK | INTRAOCULAR | Status: DC | PRN
  Administered 2023-10-23: 1 via OPHTHALMIC

## 2023-10-23 MED ORDER — FENTANYL CITRATE (PF) 100 MCG/2ML IJ SOLN
INTRAMUSCULAR | Status: DC | PRN
  Administered 2023-10-23: 100 ug via INTRAVENOUS

## 2023-10-23 SURGICAL SUPPLY — 10 items
CATARACT SUITE SIGHTPATH (MISCELLANEOUS) ×1
DISSECTOR HYDRO NUCLEUS 50X22 (MISCELLANEOUS) ×1 IMPLANT
FEE CATARACT SUITE SIGHTPATH (MISCELLANEOUS) ×1 IMPLANT
GLOVE PI ULTRA LF STRL 7.5 (GLOVE) ×1 IMPLANT
GLOVE SURG POLYISOPRENE 8.5 (GLOVE) ×1
GLOVE SURG SYN 8.5 PF PI BL (GLOVE) ×1 IMPLANT
LENS IOL TECNIS EYHANCE 19.5 (Intraocular Lens) IMPLANT
NDL FILTER BLUNT 18X1 1/2 (NEEDLE) ×1 IMPLANT
NEEDLE FILTER BLUNT 18X1 1/2 (NEEDLE) ×1
SYR 3ML LL SCALE MARK (SYRINGE) ×1 IMPLANT

## 2023-10-23 NOTE — Transfer of Care (Signed)
Immediate Anesthesia Transfer of Care Note  Patient: Bryan Barker  Procedure(s) Performed: CATARACT EXTRACTION PHACO AND INTRAOCULAR LENS PLACEMENT (IOC) LEFT  4.06  00:40.5 (Left: Eye)  Patient Location: PACU  Anesthesia Type: MAC  Level of Consciousness: awake, alert  and patient cooperative  Airway and Oxygen Therapy: Patient Spontanous Breathing and Patient connected to supplemental oxygen  Post-op Assessment: Post-op Vital signs reviewed, Patient's Cardiovascular Status Stable, Respiratory Function Stable, Patent Airway and No signs of Nausea or vomiting  Post-op Vital Signs: Reviewed and stable  Complications: No notable events documented.

## 2023-10-23 NOTE — Anesthesia Postprocedure Evaluation (Signed)
Anesthesia Post Note  Patient: Bryan Barker  Procedure(s) Performed: CATARACT EXTRACTION PHACO AND INTRAOCULAR LENS PLACEMENT (IOC) LEFT  4.06  00:40.5 (Left: Eye)  Patient location during evaluation: PACU Anesthesia Type: MAC Level of consciousness: awake and alert Pain management: pain level controlled Vital Signs Assessment: post-procedure vital signs reviewed and stable Respiratory status: spontaneous breathing, nonlabored ventilation, respiratory function stable and patient connected to nasal cannula oxygen Cardiovascular status: stable and blood pressure returned to baseline Postop Assessment: no apparent nausea or vomiting Anesthetic complications: no   No notable events documented.   Last Vitals:  Vitals:   10/23/23 0933 10/23/23 0934  BP: 124/75   Pulse: 76 70  Resp: 14 (!) 23  Temp:    SpO2: 97% 97%    Last Pain:  Vitals:   10/23/23 0830  TempSrc: Temporal  PainSc: 0-No pain                 Shiann Kam C Nicolena Schurman

## 2023-10-23 NOTE — H&P (Signed)
[facility]   Primary Care Physician:  Eustaquio Boyden, MD Ophthalmologist: Dr. Willey Blade  Pre-Procedure History & Physical: HPI:  Bryan Barker is a 72 y.o. male here for cataract surgery.   Past Medical History:  Diagnosis Date   Adenomatous colon polyp 2012   Arthritis    fingers   Cataract 2019   bilateral eyes   Dehydration 03/2011   ER for syncope attributed to dehydration   Diverticulosis    by colonoscopy   Hemorrhoids    Hyperlipidemia     Past Surgical History:  Procedure Laterality Date   CATARACT EXTRACTION W/PHACO Right 10/16/2023   Procedure: CATARACT EXTRACTION PHACO AND INTRAOCULAR LENS PLACEMENT (IOC) RIGHT 3.38 00:26.2;  Surgeon: Nevada Crane, MD;  Location: [facility];  Service: Ophthalmology;  Laterality: Right;   COLONOSCOPY  06/30/11   2012: 4 polyps (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), diverticulosis   COLONOSCOPY  10/2014   sig diverticulosis, rpt 5 yrs Leone Payor)   COLONOSCOPY  10/2019   2 TA, diverticulosis, rpt 5 yrs Leone Payor)   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 Authorizing Provider  docusate sodium (COLACE) 100 MG capsule Take 100 mg by mouth daily.   Yes [provider]  pravastatin (PRAVACHOL) 40 MG tablet Take 1 tablet (40 mg total) by mouth daily. 10/09/23  Yes Eustaquio Boyden, MD  Psyllium (METAMUCIL PO) Take by mouth daily.   Yes [provider]  aspirin 81 MG EC tablet Take 1 tablet (81 mg total) by mouth every Monday, Wednesday, and Friday. Swallow whole. 10/04/21   Eustaquio Boyden, MD    Allergies as of 10/02/2023   (No Known Allergies)    Family History  Problem Relation Age of Onset   Diabetes Mother    Hypertension Mother    Heart disease Mother        Pacer   Colon polyps Mother    Coronary artery disease Father 73       MI   Cancer Brother         esoph, mets to brain   Diabetes Brother    Alcohol abuse Sister    Colon cancer Neg Hx    Stroke Neg Hx    Esophageal cancer Neg Hx    Stomach cancer Neg Hx    Rectal cancer Neg Hx     Social History   Socioeconomic History   Marital status: Married    Spouse name: Not on file   Number of children: 2   Years of education: Not on file   Highest education level: Not on file  Occupational History   Occupation: retired    Comment: retiring 2/11  Tobacco Use   Smoking status: Former    Current packs/day: 0.00    Average packs/day: 1 pack/day for 3.0 years (3.0 ttl pk-yrs)    Types: Cigarettes    Start date: 06/16/1965    Quit date: 06/16/1968    Years since quitting: 55.3   Smokeless tobacco: Never   Tobacco comments:    only smoked as a teenager  Vaping Use   Vaping status: Never Used  Substance and Sexual Activity   Alcohol use: No    Alcohol/week: 0.0 standard drinks of alcohol   Drug use: No   Sexual activity: Yes  Other Topics Concern   Not on file  Social History Narrative   Caffeine: 1 cup/day   Married and lives with wife, 1 dog, cows   2 daughters   Occupation: retired, 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, 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   Diet: good water, fruits/vegetables daily   Social Drivers of Corporate investment banker Strain: Low Risk  (09/07/2023)   Overall Financial Resource Strain (CARDIA)    Difficulty of Paying Living Expenses: Not hard at all  Food Insecurity: No Food Insecurity (09/07/2023)   Hunger Vital Sign    Worried About Running Out of Food in the Last Year: Never true    Ran Out of Food in the Last Year: Never true  Transportation Needs: No Transportation Needs (09/07/2023)   PRAPARE - Administrator, Civil Service (Medical): No    Lack of Transportation (Non-Medical): No  Physical Activity: Sufficiently Active (09/07/2023)   Exercise Vital Sign    Days of Exercise per Week: 5 days    Minutes of  Exercise per Session: 30 min  Stress: No Stress Concern Present (09/07/2023)   [employer] of Occupational Health - Occupational Stress Questionnaire    Feeling of Stress : Not at all  Social Connections: Socially Integrated (09/07/2023)   Social Connection and Isolation Panel [NHANES]    Frequency of Communication with Friends and Family: More than three times a week    Frequency of Social Gatherings with Friends and Family: More than three times a week    Attends Religious Services: More than 4 times per year    Active Member of [employer] or Organizations: Yes    Attends Engineer, structural: More than 4 times per year    Marital Status: Married  Catering manager Violence: Not At Risk (09/07/2023)   Humiliation, Afraid, Rape, and Kick questionnaire    Fear of Current or Ex-Partner: No    Emotionally Abused: No    Physically Abused: No    Sexually Abused: No    Review of Systems: See HPI, otherwise negative ROS  Physical Exam: BP (!) 150/80   Pulse 68   Resp 17   Ht 5' 6.5" (1.689 m)   Wt 70.8 kg   SpO2 99%   BMI 24.80 kg/m  General:   Alert, cooperative in NAD Head:  Normocephalic and atraumatic. Respiratory:  Normal work of breathing. Cardiovascular:  RRR  Impression/Plan: Bryan Barker is here for cataract surgery.  Risks, benefits, limitations, and alternatives regarding cataract surgery have been reviewed with the patient.  Questions have been answered.  All parties agreeable.   Willey Blade, MD  10/23/2023, 9:01 AM

## 2023-10-23 NOTE — Op Note (Signed)
OPERATIVE NOTE  Bryan Barker [id number] 10/23/2023   PREOPERATIVE DIAGNOSIS:  Nuclear sclerotic cataract left eye.  H25.12   POSTOPERATIVE DIAGNOSIS:    Nuclear sclerotic cataract left eye.     PROCEDURE:  Phacoemusification with posterior chamber intraocular lens placement of the left eye   LENS:   Implant Name Type Inv. Item Serial No. Manufacturer Lot No. LRB No. Used Action  LENS IOL TECNIS EYHANCE 19.5 - [id number] Intraocular Lens LENS IOL TECNIS EYHANCE 19.5 [id number] [employer]PATH  Left 1 Implanted      Procedure(s): CATARACT EXTRACTION PHACO AND INTRAOCULAR LENS PLACEMENT (IOC) LEFT  4.06  00:40.5 (Left)  SURGEON:  Willey Blade, MD, MPH   ANESTHESIA:  Topical with tetracaine drops augmented with 1% preservative-free intracameral lidocaine.  ESTIMATED BLOOD LOSS: <1 mL   COMPLICATIONS:  None.   DESCRIPTION OF PROCEDURE:  The patient was identified in the holding room and transported to the operating room and placed in the supine position under the operating microscope.  The left eye was identified as the operative eye and it was prepped and draped in the usual sterile ophthalmic fashion.   A 1.0 millimeter clear-corneal paracentesis was made at the 5:00 position. 0.5 ml of preservative-free 1% lidocaine with epinephrine was injected into the anterior chamber.  The anterior chamber was filled with viscoelastic.  A 2.4 millimeter keratome was used to make a near-clear corneal incision at the 2:00 position.  A curvilinear capsulorrhexis was made with a cystotome and capsulorrhexis forceps.  Balanced salt solution was used to hydrodissect and hydrodelineate the nucleus.   Phacoemulsification was then used in stop and chop fashion to remove the lens nucleus and epinucleus.  The remaining cortex was then removed using the irrigation and aspiration handpiece. Viscoelastic was then placed into the capsular bag to distend it for lens placement.  A lens was then injected into the  capsular bag.  The remaining viscoelastic was aspirated.   Wounds were hydrated with balanced salt solution.  The anterior chamber was inflated to a physiologic pressure with balanced salt solution.  Intracameral vigamox 0.1 mL undiltued was injected into the eye and a drop placed onto the ocular surface.  No wound leaks were noted.  The patient was taken to the recovery room in stable condition without complications of anesthesia or surgery  Willey Blade 10/23/2023, 9:29 AM

## 2023-10-24 ENCOUNTER — Encounter: Payer: Self-pay | Admitting: Ophthalmology

## 2023-12-26 ENCOUNTER — Encounter: Payer: Self-pay | Admitting: Podiatry

## 2023-12-26 ENCOUNTER — Ambulatory Visit (INDEPENDENT_AMBULATORY_CARE_PROVIDER_SITE_OTHER): Payer: Medicare Other | Admitting: Podiatry

## 2023-12-26 DIAGNOSIS — M79674 Pain in right toe(s): Secondary | ICD-10-CM | POA: Diagnosis not present

## 2023-12-26 DIAGNOSIS — M79675 Pain in left toe(s): Secondary | ICD-10-CM | POA: Diagnosis not present

## 2023-12-26 DIAGNOSIS — B351 Tinea unguium: Secondary | ICD-10-CM | POA: Diagnosis not present

## 2023-12-26 NOTE — Progress Notes (Signed)
   Chief Complaint  Patient presents with   Nail Problem    "Trim my toenails."    SUBJECTIVE Patient presents to office today complaining of elongated, thickened nails that cause pain while ambulating in shoes.  He is unable to trim his own nails. Patient is here for further evaluation and treatment.  Past Medical History:  Diagnosis Date   Adenomatous colon polyp 2012   Arthritis    fingers   Cataract 2019   bilateral eyes   Dehydration 03/2011   ER for syncope attributed to dehydration   Diverticulosis    by colonoscopy   Hemorrhoids    Hyperlipidemia     OBJECTIVE General Patient is awake, alert, and oriented x 3 and in no acute distress. Derm Skin is dry and supple bilateral. Negative open lesions or macerations. Remaining integument unremarkable. Nails are tender, long, thickened and dystrophic with subungual debris, consistent with onychomycosis, 1-5 bilateral. No signs of infection noted. Vasc  DP and PT pedal pulses palpable bilaterally. Temperature gradient within normal limits.  Neuro Epicritic and protective threshold sensation grossly intact bilaterally.  Musculoskeletal Exam No symptomatic pedal deformities noted bilateral. Muscular strength within normal limits.  ASSESSMENT 1. Onychodystrophic nails 1-5 bilateral with hyperkeratosis of nails.  2. Onychomycosis of nail due to dermatophyte bilateral   PLAN OF CARE 1. Patient evaluated today.  2. Instructed to maintain good pedal hygiene and foot care.  3. Mechanical debridement of nails 1-5 bilaterally performed using a nail nipper. Filed with dremel without incident.  4. Return to clinic in 3 mos.   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    Felecia Shelling, DPM [facility]  Dr. Felecia Shelling, DPM    [address] [place], [place] [address]                Office [phone]  Fax [phone]

## 2024-01-08 ENCOUNTER — Other Ambulatory Visit (INDEPENDENT_AMBULATORY_CARE_PROVIDER_SITE_OTHER): Payer: Medicare Other

## 2024-01-08 DIAGNOSIS — R972 Elevated prostate specific antigen [PSA]: Secondary | ICD-10-CM | POA: Diagnosis not present

## 2024-01-09 LAB — PSA, TOTAL AND FREE
PSA, % Free: 25 % — ABNORMAL LOW (ref >25)
PSA, Free: 0.6 ng/mL
PSA, Total: 2.4 ng/mL (ref <=4.0)

## 2024-01-10 ENCOUNTER — Encounter: Payer: Self-pay | Admitting: Family Medicine

## 2024-03-26 ENCOUNTER — Ambulatory Visit (INDEPENDENT_AMBULATORY_CARE_PROVIDER_SITE_OTHER): Payer: Medicare Other | Admitting: Podiatry

## 2024-03-26 ENCOUNTER — Encounter: Payer: Self-pay | Admitting: Podiatry

## 2024-03-26 DIAGNOSIS — M79675 Pain in left toe(s): Secondary | ICD-10-CM | POA: Diagnosis not present

## 2024-03-26 DIAGNOSIS — B351 Tinea unguium: Secondary | ICD-10-CM | POA: Diagnosis not present

## 2024-03-26 DIAGNOSIS — M79674 Pain in right toe(s): Secondary | ICD-10-CM

## 2024-03-26 NOTE — Progress Notes (Signed)
   Chief Complaint  Patient presents with   Nail Problem    "Trim my toenails."    SUBJECTIVE Patient presents to office today complaining of elongated, thickened nails that cause pain while ambulating in shoes.  He is unable to trim his own nails. Patient is here for further evaluation and treatment.  Past Medical History:  Diagnosis Date   Adenomatous colon polyp 2012   Arthritis    fingers   Cataract 2019   bilateral eyes   Dehydration 03/2011   ER for syncope attributed to dehydration   Diverticulosis    by colonoscopy   Hemorrhoids    Hyperlipidemia     OBJECTIVE General Patient is awake, alert, and oriented x 3 and in no acute distress. Derm Skin is dry and supple bilateral. Negative open lesions or macerations. Remaining integument unremarkable. Nails are tender, long, thickened and dystrophic with subungual debris, consistent with onychomycosis, 1-5 bilateral. No signs of infection noted. Vasc  DP and PT pedal pulses palpable bilaterally. Temperature gradient within normal limits.  Neuro Epicritic and protective threshold sensation grossly intact bilaterally.  Musculoskeletal Exam No symptomatic pedal deformities noted bilateral. Muscular strength within normal limits.  ASSESSMENT 1. Onychodystrophic nails 1-5 bilateral with hyperkeratosis of nails.  2. Onychomycosis of nail due to dermatophyte bilateral   PLAN OF CARE 1. Patient evaluated today.  2. Instructed to maintain good pedal hygiene and foot care.  3. Mechanical debridement of nails 1-5 bilaterally performed using a nail nipper. Filed with dremel without incident.  4. Return to clinic in 3 mos.   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    Felecia Shelling, DPM [facility]  Dr. Felecia Shelling, DPM    [address] [place], [place] [address]                Office [phone]  Fax [phone]

## 2024-06-25 ENCOUNTER — Ambulatory Visit (INDEPENDENT_AMBULATORY_CARE_PROVIDER_SITE_OTHER): Admitting: Podiatry

## 2024-06-25 ENCOUNTER — Encounter: Payer: Self-pay | Admitting: Podiatry

## 2024-06-25 VITALS — Ht 66.6 in | Wt 156.0 lb

## 2024-06-25 DIAGNOSIS — M79674 Pain in right toe(s): Secondary | ICD-10-CM | POA: Diagnosis not present

## 2024-06-25 DIAGNOSIS — M79675 Pain in left toe(s): Secondary | ICD-10-CM

## 2024-06-25 DIAGNOSIS — B351 Tinea unguium: Secondary | ICD-10-CM | POA: Diagnosis not present

## 2024-07-09 NOTE — Progress Notes (Signed)
   Chief Complaint  Patient presents with   Nail Problem    Pt is here for RFC.    SUBJECTIVE Patient presents to office today complaining of elongated, thickened nails that cause pain while ambulating in shoes.  He is unable to trim his own nails. Patient is here for further evaluation and treatment.  Past Medical History:  Diagnosis Date   Adenomatous colon polyp 2012   Arthritis    fingers   Cataract 2019   bilateral eyes   Dehydration 03/2011   ER for syncope attributed to dehydration   Diverticulosis    by colonoscopy   Hemorrhoids    Hyperlipidemia     OBJECTIVE General Patient is awake, alert, and oriented x 3 and in no acute distress. Derm Skin is dry and supple bilateral. Negative open lesions or macerations. Remaining integument unremarkable. Nails are tender, long, thickened and dystrophic with subungual debris, consistent with onychomycosis, 1-5 bilateral. No signs of infection noted. Vasc  DP and PT pedal pulses palpable bilaterally. Temperature gradient within normal limits.  Neuro Epicritic and protective threshold sensation grossly intact bilaterally.  Musculoskeletal Exam No symptomatic pedal deformities noted bilateral. Muscular strength within normal limits.  ASSESSMENT 1. Onychodystrophic nails 1-5 bilateral with hyperkeratosis of nails.  2. Onychomycosis of nail due to dermatophyte bilateral   PLAN OF CARE 1. Patient evaluated today.  2. Instructed to maintain good pedal hygiene and foot care.  3. Mechanical debridement of nails 1-5 bilaterally performed using a nail nipper. Filed with dremel without incident.  4. Return to clinic in 3 mos.   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    Thresa EMERSON Sar, DPM [facility]  Dr. Thresa EMERSON Sar, DPM    [address] [place], [place] [address]                Office [phone]  Fax [phone]

## 2024-07-12 ENCOUNTER — Ambulatory Visit: Payer: Self-pay

## 2024-07-12 NOTE — Telephone Encounter (Signed)
 FYI Only or Action Required?: Action required by provider: clinical question for provider and update on patient condition.  Patient was last seen in primary care on 10/09/2023 by Rilla Baller, MD.  Called Nurse Triage reporting Covid Positive.  Symptoms began on Wednesday.  Interventions attempted: OTC medications: cold and sinus med.  Symptoms are: Dry throat, coughing, eyes watering, ears feels stopped up, sneezing gradually improving. Denies any SOB, fever, or chest pain.  Triage Disposition: Call PCP Within 24 Hours  Patient/caregiver understands and will follow disposition?: Yes                Copied from CRM #[id number]. Topic: Clinical - Medication Question >> Jul 12, 2024  8:49 AM Suzen RAMAN wrote: Reason for CRM: Patient tested (+) for Covid and would like to know if he can be prescribed Paxlovid. Patient would like a call back to advise.  CB#[phone] (M) Reason for Disposition  [1] HIGH RISK patient (e.g., weak immune system, age > 64 years, obesity with BMI 30 or higher, pregnant, chronic lung disease or other chronic medical condition) AND [2] COVID symptoms (e.g., cough, fever)  (Exceptions: Already seen by PCP and no new or worsening symptoms.)  Answer Assessment - Initial Assessment Questions Spoke with patient for triage. He states his wife called in earlier and he was not aware she had asked for Paxlovid.  1. COVID-19 DIAGNOSIS: How do you know that you have COVID? (e.g., positive lab test or self-test, diagnosed by doctor or NP/PA, symptoms after exposure).     Positive home COVID self test.  2. COVID-19 EXPOSURE: Was there any known exposure to COVID before the symptoms began? CDC Definition of close contact: within 6 feet (2 meters) for a total of 15 minutes or more over a 24-hour period.      No.  3. ONSET: When did the COVID-19 symptoms start?      Wednesday.  4. WORST SYMPTOM: What is your worst symptom? (e.g., cough, fever,  shortness of breath, muscle aches)     Dry throat, coughing, eyes watering, ears feels stopped up, sneezing.  5. COUGH: Do you have a cough? If Yes, ask: How bad is the cough?       Yes, not real bad. Occasional.  6. FEVER: Do you have a fever? If Yes, ask: What is your temperature, how was it measured, and when did it start?     No.   7. RESPIRATORY STATUS: Describe your breathing? (e.g., normal; shortness of breath, wheezing, unable to speak)      No SOB or wheezing. Breathing is normal.  8. BETTER-SAME-WORSE: Are you getting better, staying the same or getting worse compared to yesterday?  If getting worse, ask, In what way?     Getting better.  9. OTHER SYMPTOMS: Do you have any other symptoms?  (e.g., chills, fatigue, headache, loss of smell or taste, muscle pain, sore throat)     No. Denies chest pain.  10. HIGH RISK DISEASE: Do you have any chronic medical problems? (e.g., asthma, heart or lung disease, weak immune system, obesity, etc.)       No.  11. VACCINE: Have you had the COVID-19 vaccine? If Yes, ask: Which one, how many shots, when did you get it?       Yes, 2021 2 shots.  12. PREGNANCY: Is there any chance you are pregnant? When was your last menstrual period?       N/A.  13. O2 SATURATION MONITOR:  Do you use  an oxygen saturation monitor (pulse oximeter) at home? If Yes, ask What is your reading (oxygen level) today? What is your usual oxygen saturation reading? (e.g., 95%)       No.  Protocols used: Coronavirus (COVID-19) Diagnosed or Suspected-A-AH

## 2024-07-15 NOTE — Telephone Encounter (Signed)
 Called and spoke to patient wife on drp. Patient has had improvement in symptoms. Doing much better today. They have been doing all the supportive measures declined Paxlovid. If any further questions will let us  know.

## 2024-07-15 NOTE — Telephone Encounter (Signed)
 Marjean Krabbe, RN to [facility] Clinical      07/12/24 10:11 AM Follow up needed- patient COVID positive, see nurse triage for symptoms. Wife called in and asking if he can be prescribed Paxlovid.  Sending note to Dr Rilla and will take note to Dr KANDICE area.

## 2024-07-15 NOTE — Telephone Encounter (Signed)
 Patient called in on Friday 10am. I never received message.  Can we call pt for update on symptoms?  Today would be day 5 of symptoms so if not doing well could do Paxlovid.  Would encourage supportive measures including fluids, rest, vit C, vit D.

## 2024-08-06 ENCOUNTER — Ambulatory Visit

## 2024-08-06 DIAGNOSIS — Z23 Encounter for immunization: Secondary | ICD-10-CM | POA: Diagnosis not present

## 2024-09-09 ENCOUNTER — Ambulatory Visit: Payer: Medicare Other

## 2024-09-09 VITALS — BP 118/70 | Ht 66.6 in | Wt 156.0 lb

## 2024-09-09 DIAGNOSIS — Z0000 Encounter for general adult medical examination without abnormal findings: Secondary | ICD-10-CM | POA: Diagnosis not present

## 2024-09-09 NOTE — Patient Instructions (Signed)
 Mr. Moffa,  Thank you for taking the time for your Medicare Wellness Visit. I appreciate your continued commitment to your health goals. Please review the care plan we discussed, and feel free to reach out if I can assist you further.  Please note that Annual Wellness Visits do not include a physical exam. Some assessments may be limited, especially if the visit was conducted virtually. If needed, we may recommend an in-person follow-up with your provider.  Ongoing Care Seeing your primary care provider every 3 to 6 months helps us  monitor your health and provide consistent, personalized care.   Referrals If a referral was made during today's visit and you haven't received any updates within two weeks, please contact the referred provider directly to check on the status.  Recommended Screenings:  Health Maintenance  Topic Date Due   COVID-19 Vaccine (3 - Pfizer risk series) 02/10/2020   Medicare Annual Wellness Visit  09/06/2024   Colon Cancer Screening  10/24/2024   DTaP/Tdap/Td vaccine (3 - Tdap) 08/25/2026   Pneumococcal Vaccine for age over 40  Completed   Flu Shot  Completed   Hepatitis C Screening  Completed   Zoster (Shingles) Vaccine  Completed   Meningitis B Vaccine  Aged Out       09/02/2024    9:07 PM  Advanced Directives  Does Patient Have a Medical Advance Directive? Yes  Type of Estate Agent of [place];Living will  Does patient want to make changes to medical advance directive? No - Patient declined  Copy of Healthcare Power of Attorney in Chart? Yes - validated most recent copy scanned in chart (See row information)    Vision: Annual vision screenings are recommended for early detection of glaucoma, cataracts, and diabetic retinopathy. These exams can also reveal signs of chronic conditions such as diabetes and high blood pressure.  Dental: Annual dental screenings help detect early signs of oral cancer, gum disease, and other conditions  linked to overall health, including heart disease and diabetes.  Please see the attached documents for additional preventive care recommendations.

## 2024-09-09 NOTE — Progress Notes (Signed)
 Because this visit was a virtual/telehealth visit,  certain criteria was not obtained, such a blood pressure, CBG if applicable, and timed get up and go. Any medications not marked as taking were not mentioned during the medication reconciliation part of the visit. Any vitals not documented were not able to be obtained due to this being a telehealth visit or patient was unable to self-report a recent blood pressure reading due to a lack of equipment at home via telehealth. Vitals that have been documented are verbally provided by the patient.   This visit was performed by a medical professional under my direct supervision. I was immediately available for consultation/collaboration. I have reviewed and agree with the Annual Wellness Visit documentation.   Visit info / Clinical Intake: Medicare Wellness Visit Type:: Subsequent Annual Wellness Visit Medicare Wellness Visit Mode:: Telephone If telephone:: video declined If telephone or video:: pt reported vitals Interpreter Needed?: No Pre-visit prep was completed: no AWV questionnaire completed by patient prior to visit?: yes Date:: 09/02/24 Living arrangements:: lives with spouse/significant other Patient's Overall Health Status Rating: excellent Typical amount of pain: none Does pain affect daily life?: no Are you currently prescribed opioids?: no  Dietary Habits and Nutritional Risks How many meals a day?: 3 Eats fruit and vegetables daily?: yes Most meals are obtained by: having others provide food; eating out; preparing own meals In the last 2 weeks, have you had any of the following?: -- (none)  Functional Status Activities of Daily Living (to include ambulation/medication): (Patient-Rptd) Independent Ambulation: (Patient-Rptd) Independent Medication Administration: Independent Home Management: (Patient-Rptd) Independent Manage your own finances?: yes Primary transportation is: driving Concerns about vision?: no *vision  screening is required for WTM* Concerns about hearing?: no  Fall Screening Falls in the past year?: (Patient-Rptd) 0 Number of falls in past year: (Patient-Rptd) 0 Was there an injury with Fall?: (Patient-Rptd) 0 Fall Risk Category Calculator: (Patient-Rptd) 0 Patient Fall Risk Level: (Patient-Rptd) Low Fall Risk  Fall Risk Patient at Risk for Falls Due to: No Fall Risks Fall risk Follow up: Falls evaluation completed; Falls prevention discussed; Education provided  Home and Transportation Safety: All rugs have non-skid backing?: yes All stairs or steps have railings?: yes Grab bars in the bathtub or shower?: (!) no Have non-skid surface in bathtub or shower?: yes Good home lighting?: yes Regular seat belt use?: yes Hospital stays in the last year:: no  Cognitive Assessment Difficulty concentrating, remembering, or making decisions? : no Will 6CIT or Mini Cog be Completed: yes What year is it?: 0 points What month is it?: 0 points Give patient an address phrase to remember (5 components): remmber 3 words apple , table, penny About what time is it?: 0 points Count backwards from 20 to 1: 0 points Say the months of the year in reverse: 0 points Repeat the address phrase from earlier: 0 points 6 CIT Score: 0 points  Advance Directives (For Healthcare) Does Patient Have a Medical Advance Directive?: Yes Does patient want to make changes to medical advance directive?: No - Patient declined Type of Advance Directive: Healthcare Power of [place]; Living will Copy of Healthcare Power of Attorney in Chart?: Yes - validated most recent copy scanned in chart (See row information) Copy of Living Will in Chart?: Yes - validated most recent copy scanned in chart (See row information)  Reviewed/Updated  Reviewed/Updated: All    Subjective:   Bryan Barker is a 73 y.o. male who presents for a Medicare Annual Wellness Visit.  Allergies (verified)  Patient has no known allergies.    History: Past Medical History:  Diagnosis Date   Adenomatous colon polyp 2012   Arthritis    fingers   Cataract 2019   bilateral eyes   Dehydration 03/2011   ER for syncope attributed to dehydration   Diverticulosis    by colonoscopy   Hemorrhoids    Hyperlipidemia    Past Surgical History:  Procedure Laterality Date   CATARACT EXTRACTION W/PHACO Right 10/16/2023   Procedure: CATARACT EXTRACTION PHACO AND INTRAOCULAR LENS PLACEMENT (IOC) RIGHT 3.38 00:26.2;  Surgeon: Myrna Adine Anes, MD;  Location: [facility];  Service: Ophthalmology;  Laterality: Right;   CATARACT EXTRACTION W/PHACO Left 10/23/2023   Procedure: CATARACT EXTRACTION PHACO AND INTRAOCULAR LENS PLACEMENT (IOC) LEFT  4.06  00:40.5;  Surgeon: Myrna Adine Anes, MD;  Location: [facility];  Service: Ophthalmology;  Laterality: Left;   COLONOSCOPY  06/30/11   2012: 4 polyps (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), diverticulosis   COLONOSCOPY  10/2014   sig diverticulosis, rpt 5 yrs Ollen)   COLONOSCOPY  10/2019   2 TA, diverticulosis, rpt 5 yrs Ollen)   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 (Murphy/Wainer)    Family History  Problem Relation Age of Onset   Diabetes Mother    Hypertension Mother    Heart disease Mother        Pacer   Colon polyps Mother    Coronary artery disease Father 32       MI   Cancer Brother        esoph, mets to brain   Diabetes Brother    Alcohol abuse Sister    Colon cancer Neg Hx    Stroke Neg Hx    Esophageal cancer Neg Hx    Stomach cancer Neg Hx    Rectal cancer Neg Hx    Social History   Occupational History   Occupation: retired    Comment: retiring 2/11  Tobacco Use   Smoking status: Former    Current packs/day: 0.00    Average packs/day: 1 pack/day for 3.0 years (3.0 ttl pk-yrs)    Types: Cigarettes    Start date: 06/16/1965    Quit date: 06/16/1968    Years since quitting: 56.2   Smokeless tobacco:  Never   Tobacco comments:    only smoked as a teenager  Vaping Use   Vaping status: Never Used  Substance and Sexual Activity   Alcohol use: No    Alcohol/week: 0.0 standard drinks of alcohol   Drug use: No   Sexual activity: Yes   Tobacco Counseling Counseling given: Not Answered Tobacco comments: only smoked as a teenager  SDOH Screenings   Food Insecurity: No Food Insecurity (09/09/2024)  Housing: Low Risk  (09/09/2024)  Transportation Needs: No Transportation Needs (09/09/2024)  Utilities: Not At Risk (09/09/2024)  Alcohol Screen: Low Risk  (09/07/2023)  Depression (PHQ2-9): Low Risk  (09/09/2024)  Financial Resource Strain: Low Risk  (09/07/2023)  Physical Activity: Sufficiently Active (09/09/2024)  Social Connections: Socially Integrated (09/09/2024)  Stress: No Stress Concern Present (09/09/2024)  Tobacco Use: Medium Risk (09/09/2024)  Health Literacy: Adequate Health Literacy (09/09/2024)   Depression Screen    09/09/2024    3:00 PM 10/09/2023    8:16 AM 09/07/2023    9:03 AM 10/03/2022   12:09 PM 09/28/2021    1:20 PM 09/29/2020    8:21 AM 09/12/2019   10:33 AM  PHQ 2/9 Scores  PHQ - 2 Score 0 0 0 0 0 0 0  PHQ- 9 Score 0   0   0     Goals Addressed             This Visit's Progress    Patient Stated   On track    09/12/2019, I will maintain and continue medications as prescribed.      Patient Stated   On track    Would like to maintain weight.        Visit info / Clinical Intake: Medicare Wellness Visit Type:: Subsequent Annual Wellness Visit Medicare Wellness Visit Mode:: Telephone If telephone:: video declined If telephone or video:: pt reported vitals Interpreter Needed?: No Pre-visit prep was completed: no AWV questionnaire completed by patient prior to visit?: yes Date:: 09/02/24 Living arrangements:: lives with spouse/significant other Patient's Overall Health Status Rating: excellent Typical amount of pain: none Does pain affect daily life?:  no Are you currently prescribed opioids?: no  Dietary Habits and Nutritional Risks How many meals a day?: 3 Eats fruit and vegetables daily?: yes Most meals are obtained by: having others provide food; eating out; preparing own meals In the last 2 weeks, have you had any of the following?: -- (none)  Functional Status Activities of Daily Living (to include ambulation/medication): (Patient-Rptd) Independent Ambulation: (Patient-Rptd) Independent Medication Administration: Independent Home Management: (Patient-Rptd) Independent Manage your own finances?: yes Primary transportation is: driving Concerns about vision?: no *vision screening is required for WTM* Concerns about hearing?: no  Fall Screening Falls in the past year?: (Patient-Rptd) 0 Number of falls in past year: (Patient-Rptd) 0 Was there an injury with Fall?: (Patient-Rptd) 0 Fall Risk Category Calculator: (Patient-Rptd) 0 Patient Fall Risk Level: (Patient-Rptd) Low Fall Risk  Fall Risk Patient at Risk for Falls Due to: No Fall Risks Fall risk Follow up: Falls evaluation completed; Falls prevention discussed; Education provided  Home and Transportation Safety: All rugs have non-skid backing?: yes All stairs or steps have railings?: yes Grab bars in the bathtub or shower?: (!) no Have non-skid surface in bathtub or shower?: yes Good home lighting?: yes Regular seat belt use?: yes Hospital stays in the last year:: no  Cognitive Assessment Difficulty concentrating, remembering, or making decisions? : no Will 6CIT or Mini Cog be Completed: yes What year is it?: 0 points What month is it?: 0 points Give patient an address phrase to remember (5 components): remmber 3 words apple , table, penny About what time is it?: 0 points Count backwards from 20 to 1: 0 points Say the months of the year in reverse: 0 points Repeat the address phrase from earlier: 0 points 6 CIT Score: 0 points  Advance Directives (For  Healthcare) Does Patient Have a Medical Advance Directive?: Yes Does patient want to make changes to medical advance directive?: No - Patient declined Type of Advance Directive: Healthcare Power of [place]; Living will Copy of Healthcare Power of Attorney in Chart?: Yes - validated most recent copy scanned in chart (See row information) Copy of Living Will in Chart?: Yes - validated most recent copy scanned in chart (See row information)  Reviewed/Updated  Reviewed/Updated: All        Objective:    Today's Vitals   09/09/24 1459  BP: 118/70  Weight: 156 lb (70.8 kg)  Height: 5' 6.6 (1.692 m)   Body mass index is 24.73 kg/m.  Current Medications (verified) Outpatient Encounter Medications as of 09/09/2024  Medication Sig   aspirin  81 MG EC  tablet Take 1 tablet (81 mg total) by mouth every Monday, Wednesday, and Friday. Swallow whole.   docusate sodium (COLACE) 100 MG capsule Take 100 mg by mouth daily.   pravastatin  (PRAVACHOL ) 40 MG tablet Take 1 tablet (40 mg total) by mouth daily.   Psyllium (METAMUCIL PO) Take by mouth daily.   No facility-administered encounter medications on file as of 09/09/2024.   Hearing/Vision screen Hearing Screening - Comments:: Some difficulties but no hearing aids Vision Screening - Comments:: Patient wears glasses  Immunizations and Health Maintenance Health Maintenance  Topic Date Due   COVID-19 Vaccine (3 - Pfizer risk series) 02/10/2020   Colonoscopy  10/24/2024   Medicare Annual Wellness (AWV)  09/09/2025   DTaP/Tdap/Td (3 - Tdap) 08/25/2026   Pneumococcal Vaccine: 50+ Years  Completed   Influenza Vaccine  Completed   Hepatitis C Screening  Completed   Zoster Vaccines- Shingrix  Completed   Meningococcal B Vaccine  Aged Out        Assessment/Plan:  This is a routine wellness examination for [place].  Patient Care Team: Rilla Baller, MD as PCP - General (Family Medicine) Toribio Lesches, DDS as Referring Physician  (Dentistry) Avram Lupita BRAVO, MD as Consulting Physician (Gastroenterology)  I have personally reviewed and noted the following in the patient's chart:   Medical and social history Use of alcohol, tobacco or illicit drugs  Current medications and supplements including opioid prescriptions. Functional ability and status Nutritional status Physical activity Advanced directives List of other physicians Hospitalizations, surgeries, and ER visits in previous 12 months Vitals Screenings to include cognitive, depression, and falls Referrals and appointments  No orders of the defined types were placed in this encounter.  In addition, I have reviewed and discussed with patient certain preventive protocols, quality metrics, and best practice recommendations. A written personalized care plan for preventive services as well as general preventive health recommendations were provided to patient.   Lyle MARLA Right, CMA   09/09/2024   Return in 1 year (on 09/09/2025).  After Visit Summary: (MyChart) Due to this being a telephonic visit, the after visit summary with patients personalized plan was offered to patient via MyChart   Nurse Notes: nothing to report at this time

## 2024-09-19 NOTE — Progress Notes (Signed)
 I connected with  [name] on 09/09/2024 by a audio enabled telemedicine application and verified that I am speaking with the correct person using two identifiers.  Patient Location: Home  Provider Location: Home Office  Persons Participating in Visit: Patient.  I discussed the limitations of evaluation and management by telemedicine. The patient expressed understanding and agreed to proceed.  Vital Signs: Because this visit was a virtual/telehealth visit, some criteria may be missing or patient reported. Any vitals not documented were not able to be obtained and vitals that have been documented are patient reported.  Because this visit was a virtual/telehealth visit,  certain criteria was not obtained, such a blood pressure, CBG if applicable, and timed get up and go. Any medications not marked as taking were not mentioned during the medication reconciliation part of the visit. Any vitals not documented were not able to be obtained due to this being a telehealth visit or patient was unable to self-report a recent blood pressure reading due to a lack of equipment at home via telehealth. Vitals that have been documented are verbally provided by the patient.   This visit was performed by a medical professional under my direct supervision. I was immediately available for consultation/collaboration. I have reviewed and agree with the Annual Wellness Visit documentation.   Visit info / Clinical Intake: Medicare Wellness Visit Type:: Subsequent Annual Wellness Visit Medicare Wellness Visit Mode:: Telephone If telephone:: video declined Because this visit was a virtual/telehealth visit:: pt reported vitals Interpreter Needed?: No Pre-visit prep was completed: no AWV questionnaire completed by patient prior to visit?: yes Date:: 09/02/24 Living arrangements:: lives with spouse/significant other Patient's Overall Health Status Rating: excellent Typical amount of pain: none Does pain  affect daily life?: no Are you currently prescribed opioids?: no  Dietary Habits and Nutritional Risks How many meals a day?: 3 Eats fruit and vegetables daily?: yes Most meals are obtained by: having others provide food; eating out; preparing own meals In the last 2 weeks, have you had any of the following?: -- (none)  Functional Status Activities of Daily Living (to include ambulation/medication): (Patient-Rptd) Independent Ambulation: (Patient-Rptd) Independent Medication Administration: Independent Home Management: (Patient-Rptd) Independent Manage your own finances?: yes Primary transportation is: driving Concerns about vision?: no *vision screening is required for WTM* Concerns about hearing?: no  Fall Screening Falls in the past year?: (Patient-Rptd) 0 Number of falls in past year: (Patient-Rptd) 0 Was there an injury with Fall?: (Patient-Rptd) 0 Fall Risk Category Calculator: (Patient-Rptd) 0 Patient Fall Risk Level: (Patient-Rptd) Low Fall Risk  Fall Risk Patient at Risk for Falls Due to: No Fall Risks Fall risk Follow up: Falls evaluation completed; Falls prevention discussed; Education provided  Home and Transportation Safety: All rugs have non-skid backing?: yes All stairs or steps have railings?: yes Grab bars in the bathtub or shower?: (!) no Have non-skid surface in bathtub or shower?: yes Good home lighting?: yes Regular seat belt use?: yes Hospital stays in the last year:: no  Cognitive Assessment Difficulty concentrating, remembering, or making decisions? : no Will 6CIT or Mini Cog be Completed: yes What year is it?: 0 points What month is it?: 0 points Give patient an address phrase to remember (5 components): remmber 3 words apple , table, penny About what time is it?: 0 points Count backwards from 20 to 1: 0 points Say the months of the year in reverse: 0 points Repeat the address phrase from earlier: 0 points 6 CIT Score: 0 points  Advance  Directives (For Healthcare) Does Patient Have a Medical Advance Directive?: Yes Does patient want to make changes to medical advance directive?: No - Patient declined Type of Advance Directive: Healthcare Power of [place]; Living will Copy of Healthcare Power of Attorney in Chart?: Yes - validated most recent copy scanned in chart (See row information) Copy of Living Will in Chart?: Yes - validated most recent copy scanned in chart (See row information)  Reviewed/Updated  Reviewed/Updated: All    Subjective:   Bryan Barker is a 73 y.o. male who presents for a Medicare Annual Wellness Visit.  Allergies (verified) Patient has no known allergies.   History: Past Medical History:  Diagnosis Date   Adenomatous colon polyp 2012   Arthritis    fingers   Cataract 2019   bilateral eyes   Dehydration 03/2011   ER for syncope attributed to dehydration   Diverticulosis    by colonoscopy   Hemorrhoids    Hyperlipidemia    Past Surgical History:  Procedure Laterality Date   CATARACT EXTRACTION W/PHACO Right 10/16/2023   Procedure: CATARACT EXTRACTION PHACO AND INTRAOCULAR LENS PLACEMENT (IOC) RIGHT 3.38 00:26.2;  Surgeon: Myrna Adine Anes, MD;  Location: [facility];  Service: Ophthalmology;  Laterality: Right;   CATARACT EXTRACTION W/PHACO Left 10/23/2023   Procedure: CATARACT EXTRACTION PHACO AND INTRAOCULAR LENS PLACEMENT (IOC) LEFT  4.06  00:40.5;  Surgeon: Myrna Adine Anes, MD;  Location: [facility];  Service: Ophthalmology;  Laterality: Left;   COLONOSCOPY  06/30/11   2012: 4 polyps (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), diverticulosis   COLONOSCOPY  10/2014   sig diverticulosis, rpt 5 yrs Ollen)   COLONOSCOPY  10/2019   2 TA, diverticulosis, rpt 5 yrs Ollen)   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 (Murphy/Wainer)    Family History  Problem Relation Age of Onset   Diabetes Mother    Hypertension Mother    Heart  disease Mother        Pacer   Colon polyps Mother    Coronary artery disease Father 56       MI   Cancer Brother        esoph, mets to brain   Diabetes Brother    Alcohol abuse Sister    Colon cancer Neg Hx    Stroke Neg Hx    Esophageal cancer Neg Hx    Stomach cancer Neg Hx    Rectal cancer Neg Hx    Social History   Occupational History   Occupation: retired    Comment: retiring 2/11  Tobacco Use   Smoking status: Former    Current packs/day: 0.00    Average packs/day: 1 pack/day for 3.0 years (3.0 ttl pk-yrs)    Types: Cigarettes    Start date: 06/16/1965    Quit date: 06/16/1968    Years since quitting: 56.2   Smokeless tobacco: Never   Tobacco comments:    only smoked as a teenager  Vaping Use   Vaping status: Never Used  Substance and Sexual Activity   Alcohol use: No    Alcohol/week: 0.0 standard drinks of alcohol   Drug use: No   Sexual activity: Yes   Tobacco Counseling Counseling given: Not Answered Tobacco comments: only smoked as a teenager  SDOH Screenings   Food Insecurity: No Food Insecurity (09/09/2024)  Housing: Low Risk  (09/09/2024)  Transportation Needs: No Transportation Needs (09/09/2024)  Utilities: Not At Risk (09/09/2024)  Alcohol Screen: Low Risk  (09/07/2023)  Depression (PHQ2-9): Low Risk  (09/09/2024)  Financial Resource Strain: Low Risk  (09/07/2023)  Physical Activity: Sufficiently Active (09/09/2024)  Social Connections: Socially Integrated (09/09/2024)  Stress: No Stress Concern Present (09/09/2024)  Tobacco Use: Medium Risk (09/09/2024)  Health Literacy: Adequate Health Literacy (09/09/2024)   Depression Screen    09/09/2024    3:00 PM 10/09/2023    8:16 AM 09/07/2023    9:03 AM 10/03/2022   12:09 PM 09/28/2021    1:20 PM 09/29/2020    8:21 AM 09/12/2019   10:33 AM  PHQ 2/9 Scores  PHQ - 2 Score 0 0 0 0 0 0 0  PHQ- 9 Score 0    0    0      Data saved with a previous flowsheet row definition     Goals Addressed              This Visit's Progress    Patient Stated   On track    09/12/2019, I will maintain and continue medications as prescribed.      Patient Stated   On track    Would like to maintain weight.        Visit info / Clinical Intake: Medicare Wellness Visit Type:: Subsequent Annual Wellness Visit Medicare Wellness Visit Mode:: Telephone If telephone:: video declined Because this visit was a virtual/telehealth visit:: pt reported vitals Interpreter Needed?: No Pre-visit prep was completed: no AWV questionnaire completed by patient prior to visit?: yes Date:: 09/02/24 Living arrangements:: lives with spouse/significant other Patient's Overall Health Status Rating: excellent Typical amount of pain: none Does pain affect daily life?: no Are you currently prescribed opioids?: no  Dietary Habits and Nutritional Risks How many meals a day?: 3 Eats fruit and vegetables daily?: yes Most meals are obtained by: having others provide food; eating out; preparing own meals In the last 2 weeks, have you had any of the following?: -- (none)  Functional Status Activities of Daily Living (to include ambulation/medication): (Patient-Rptd) Independent Ambulation: (Patient-Rptd) Independent Medication Administration: Independent Home Management: (Patient-Rptd) Independent Manage your own finances?: yes Primary transportation is: driving Concerns about vision?: no *vision screening is required for WTM* Concerns about hearing?: no  Fall Screening Falls in the past year?: (Patient-Rptd) 0 Number of falls in past year: (Patient-Rptd) 0 Was there an injury with Fall?: (Patient-Rptd) 0 Fall Risk Category Calculator: (Patient-Rptd) 0 Patient Fall Risk Level: (Patient-Rptd) Low Fall Risk  Fall Risk Patient at Risk for Falls Due to: No Fall Risks Fall risk Follow up: Falls evaluation completed; Falls prevention discussed; Education provided  Home and Transportation Safety: All rugs have non-skid  backing?: yes All stairs or steps have railings?: yes Grab bars in the bathtub or shower?: (!) no Have non-skid surface in bathtub or shower?: yes Good home lighting?: yes Regular seat belt use?: yes Hospital stays in the last year:: no  Cognitive Assessment Difficulty concentrating, remembering, or making decisions? : no Will 6CIT or Mini Cog be Completed: yes What year is it?: 0 points What month is it?: 0 points Give patient an address phrase to remember (5 components): remmber 3 words apple , table, penny About what time is it?: 0 points Count backwards from 20 to 1: 0 points Say the months of the year in reverse: 0 points Repeat the address phrase from earlier: 0 points 6 CIT Score: 0 points  Advance Directives (For Healthcare) Does Patient Have a Medical Advance Directive?: Yes Does patient want  to make changes to medical advance directive?: No - Patient declined Type of Advance Directive: Healthcare Power of [place]; Living will Copy of Healthcare Power of Attorney in Chart?: Yes - validated most recent copy scanned in chart (See row information) Copy of Living Will in Chart?: Yes - validated most recent copy scanned in chart (See row information)  Reviewed/Updated  Reviewed/Updated: All        Objective:    Today's Vitals   09/09/24 1459  BP: 118/70  Weight: 156 lb (70.8 kg)  Height: 5' 6.6 (1.692 m)   Body mass index is 24.73 kg/m.  Current Medications (verified) Outpatient Encounter Medications as of 09/09/2024  Medication Sig   aspirin  81 MG EC tablet Take 1 tablet (81 mg total) by mouth every Monday, Wednesday, and Friday. Swallow whole.   docusate sodium (COLACE) 100 MG capsule Take 100 mg by mouth daily.   pravastatin  (PRAVACHOL ) 40 MG tablet Take 1 tablet (40 mg total) by mouth daily.   Psyllium (METAMUCIL PO) Take by mouth daily.   No facility-administered encounter medications on file as of 09/09/2024.   Hearing/Vision screen Hearing Screening -  Comments:: Some difficulties but no hearing aids Vision Screening - Comments:: Patient wears glasses  Immunizations and Health Maintenance Health Maintenance  Topic Date Due   COVID-19 Vaccine (3 - Pfizer risk series) 02/10/2020   Colonoscopy  10/24/2024   Medicare Annual Wellness (AWV)  09/09/2025   DTaP/Tdap/Td (3 - Tdap) 08/25/2026   Pneumococcal Vaccine: 50+ Years  Completed   Influenza Vaccine  Completed   Hepatitis C Screening  Completed   Zoster Vaccines- Shingrix  Completed   Meningococcal B Vaccine  Aged Out        Assessment/Plan:  This is a routine wellness examination for [place].  Patient Care Team: Rilla Baller, MD as PCP - General (Family Medicine) Toribio Lesches, DDS as Referring Physician (Dentistry) Avram Lupita BRAVO, MD as Consulting Physician (Gastroenterology)  I have personally reviewed and noted the following in the patient's chart:   Medical and social history Use of alcohol, tobacco or illicit drugs  Current medications and supplements including opioid prescriptions. Functional ability and status Nutritional status Physical activity Advanced directives List of other physicians Hospitalizations, surgeries, and ER visits in previous 12 months Vitals Screenings to include cognitive, depression, and falls Referrals and appointments  No orders of the defined types were placed in this encounter.  In addition, I have reviewed and discussed with patient certain preventive protocols, quality metrics, and best practice recommendations. A written personalized care plan for preventive services as well as general preventive health recommendations were provided to patient.   Lyle MARLA Right, CMA   09/19/2024   Return in 1 year (on 09/09/2025).  After Visit Summary: (MyChart) Due to this being a telephonic visit, the after visit summary with patients personalized plan was offered to patient via MyChart   Nurse Notes: nothing to report at this time

## 2024-09-27 ENCOUNTER — Encounter: Payer: Self-pay | Admitting: Podiatry

## 2024-09-27 ENCOUNTER — Ambulatory Visit: Admitting: Podiatry

## 2024-09-27 VITALS — Ht 66.0 in | Wt 156.0 lb

## 2024-09-27 DIAGNOSIS — M79675 Pain in left toe(s): Secondary | ICD-10-CM | POA: Diagnosis not present

## 2024-09-27 DIAGNOSIS — B351 Tinea unguium: Secondary | ICD-10-CM | POA: Diagnosis not present

## 2024-09-27 DIAGNOSIS — M79674 Pain in right toe(s): Secondary | ICD-10-CM | POA: Diagnosis not present

## 2024-09-27 NOTE — Progress Notes (Signed)
   Chief Complaint  Patient presents with   Nail Problem    RFC.    SUBJECTIVE Patient presents to office today complaining of elongated, thickened nails that cause pain while ambulating in shoes.  He is unable to trim his own nails. Patient is here for further evaluation and treatment.  Past Medical History:  Diagnosis Date   Adenomatous colon polyp 2012   Arthritis    fingers   Cataract 2019   bilateral eyes   Dehydration 03/2011   ER for syncope attributed to dehydration   Diverticulosis    by colonoscopy   Hemorrhoids    Hyperlipidemia     OBJECTIVE General Patient is awake, alert, and oriented x 3 and in no acute distress. Derm Skin is dry and supple bilateral. Negative open lesions or macerations. Remaining integument unremarkable. Nails are tender, long, thickened and dystrophic with subungual debris, consistent with onychomycosis, 1-5 bilateral. No signs of infection noted. Vasc  DP and PT pedal pulses palpable bilaterally. Temperature gradient within normal limits.  Neuro Epicritic and protective threshold sensation grossly intact bilaterally.  Musculoskeletal Exam No symptomatic pedal deformities noted bilateral. Muscular strength within normal limits.  ASSESSMENT 1. Onychodystrophic nails 1-5 bilateral with hyperkeratosis of nails.  2. Onychomycosis of nail due to dermatophyte bilateral   PLAN OF CARE 1. Patient evaluated today.  2. Instructed to maintain good pedal hygiene and foot care.  3. Mechanical debridement of nails 1-5 bilaterally performed using a nail nipper. Filed with dremel without incident.  4. Return to clinic in 3 mos.   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    Thresa EMERSON Sar, DPM [facility]  Dr. Thresa EMERSON Sar, DPM    [address] [place], [place] [address]                Office [phone]  Fax [phone]

## 2024-10-05 ENCOUNTER — Other Ambulatory Visit: Payer: Self-pay | Admitting: Family Medicine

## 2024-10-05 DIAGNOSIS — E7800 Pure hypercholesterolemia, unspecified: Secondary | ICD-10-CM

## 2024-10-05 DIAGNOSIS — R972 Elevated prostate specific antigen [PSA]: Secondary | ICD-10-CM

## 2024-10-05 DIAGNOSIS — R7303 Prediabetes: Secondary | ICD-10-CM

## 2024-10-07 ENCOUNTER — Ambulatory Visit: Payer: Self-pay | Admitting: Family Medicine

## 2024-10-07 ENCOUNTER — Other Ambulatory Visit (INDEPENDENT_AMBULATORY_CARE_PROVIDER_SITE_OTHER): Payer: Medicare Other

## 2024-10-07 DIAGNOSIS — R972 Elevated prostate specific antigen [PSA]: Secondary | ICD-10-CM | POA: Diagnosis not present

## 2024-10-07 DIAGNOSIS — E7800 Pure hypercholesterolemia, unspecified: Secondary | ICD-10-CM

## 2024-10-07 DIAGNOSIS — R7303 Prediabetes: Secondary | ICD-10-CM

## 2024-10-07 LAB — COMPREHENSIVE METABOLIC PANEL WITH GFR
ALT: 14 U/L (ref 0–53)
AST: 20 U/L (ref 0–37)
Albumin: 4.1 g/dL (ref 3.5–5.2)
Alkaline Phosphatase: 67 U/L (ref 39–117)
BUN: 12 mg/dL (ref 6–23)
CO2: 30 meq/L (ref 19–32)
Calcium: 9.6 mg/dL (ref 8.4–10.5)
Chloride: 103 meq/L (ref 96–112)
Creatinine, Ser: 1.07 mg/dL (ref 0.40–1.50)
GFR: 67.9 mL/min (ref >60.00)
Glucose, Bld: 100 mg/dL — ABNORMAL HIGH (ref 70–99)
Potassium: 4.8 meq/L (ref 3.5–5.1)
Sodium: 141 meq/L (ref 135–145)
Total Bilirubin: 0.5 mg/dL (ref 0.2–1.2)
Total Protein: 6.6 g/dL (ref 6.0–8.3)

## 2024-10-07 LAB — LIPID PANEL
Cholesterol: 155 mg/dL (ref 0–200)
HDL: 35.7 mg/dL — ABNORMAL LOW (ref >39.00)
LDL Cholesterol: 103 mg/dL — ABNORMAL HIGH (ref 0–99)
NonHDL: 119.35
Total CHOL/HDL Ratio: 4
Triglycerides: 83 mg/dL (ref 0.0–149.0)
VLDL: 16.6 mg/dL (ref 0.0–40.0)

## 2024-10-07 LAB — HEMOGLOBIN A1C: Hgb A1c MFr Bld: 5.5 % (ref 4.6–6.5)

## 2024-10-07 LAB — PSA: PSA: 3.32 ng/mL (ref 0.10–4.00)

## 2024-10-14 ENCOUNTER — Encounter: Payer: Self-pay | Admitting: Family Medicine

## 2024-10-14 ENCOUNTER — Ambulatory Visit (INDEPENDENT_AMBULATORY_CARE_PROVIDER_SITE_OTHER): Payer: Medicare Other | Admitting: Family Medicine

## 2024-10-14 VITALS — BP 126/80 | HR 64 | Temp 97.7°F | Ht 66.65 in | Wt 162.0 lb

## 2024-10-14 DIAGNOSIS — Z23 Encounter for immunization: Secondary | ICD-10-CM | POA: Diagnosis not present

## 2024-10-14 DIAGNOSIS — U071 COVID-19: Secondary | ICD-10-CM

## 2024-10-14 DIAGNOSIS — R0600 Dyspnea, unspecified: Secondary | ICD-10-CM | POA: Insufficient documentation

## 2024-10-14 DIAGNOSIS — E7800 Pure hypercholesterolemia, unspecified: Secondary | ICD-10-CM | POA: Diagnosis not present

## 2024-10-14 DIAGNOSIS — R7303 Prediabetes: Secondary | ICD-10-CM

## 2024-10-14 DIAGNOSIS — Z7189 Other specified counseling: Secondary | ICD-10-CM

## 2024-10-14 DIAGNOSIS — Z0000 Encounter for general adult medical examination without abnormal findings: Secondary | ICD-10-CM | POA: Diagnosis not present

## 2024-10-14 MED ORDER — BENZONATATE 100 MG PO CAPS: 100.0000 mg | ORAL_CAPSULE | Freq: Two times a day (BID) | ORAL | 0 refills | Status: AC | PRN

## 2024-10-14 MED ORDER — PRAVASTATIN SODIUM 40 MG PO TABS: 40.0000 mg | ORAL_TABLET | Freq: Every day | ORAL | 3 refills | Status: AC

## 2024-10-14 NOTE — Assessment & Plan Note (Signed)
 Preventative protocols reviewed and updated unless pt declined. Discussed healthy diet and lifestyle.

## 2024-10-14 NOTE — Assessment & Plan Note (Signed)
 Chronic, stable on pravastatin  40mg  daily - continue. The 10-year ASCVD risk score (Arnett DK, et al., 2019) is: 24.8%   Values used to calculate the score:     Age: 73 years     Clincally relevant sex: Male     Is Non-Hispanic African American: No     Diabetic: No     Tobacco smoker: No     Systolic Blood Pressure: 126 mmHg     Is BP treated: No     HDL Cholesterol: 35.7 mg/dL     Total Cholesterol: 155 mg/dL

## 2024-10-14 NOTE — Assessment & Plan Note (Signed)
 Advanced directives: scanned in chart 2024. Oldest daughter Royden Purl is HCPOA followed by Maryfrances Bunnell. Full code, but would not want prolonged life support if terminal condition. Doesn't think would want intubation.

## 2024-10-14 NOTE — Assessment & Plan Note (Signed)
 A1c levels are back to normal.

## 2024-10-14 NOTE — Progress Notes (Signed)
 Ph: [phone] Fax: [phone]   Patient ID: Bryan Barker, male    DOB: [birth year], 73 y.o.   MRN: [id number]  This visit was conducted in person.  BP 126/80 (Cuff Size: Normal)   Pulse 64   Temp 97.7 F (36.5 C) (Oral)   Ht 5' 6.65 (1.693 m)   Wt 162 lb (73.5 kg)   SpO2 100%   BMI 25.64 kg/m    CC: CPE Subjective:   HPI: Bryan Barker is a 73 y.o. male presenting on 10/14/2024 for Annual Exam (Acute concerns of cold , 4 weeks onset, Stuffy nose, light cough and dry throat (when singing)/)   Saw health advisor 09/2024 for medicare wellness visit. Note reviewed.   No results found.  Flowsheet Row Office Visit from 10/14/2024 in [facility] [place]  PHQ-2 Total Score 0       10/14/2024    8:35 AM 09/02/2024    9:07 PM 10/09/2023    8:16 AM 09/07/2023    9:02 AM 03/30/2023    3:22 PM  Fall Risk   Falls in the past year? 0 0 0 0 0  Number falls in past yr: 0 0  0 0  Injury with Fall? 0 0   0  0   Risk for fall due to : No Fall Risks No Fall Risks  No Fall Risks No Fall Risks  Follow up  Falls evaluation completed;Falls prevention discussed;Education provided  Falls prevention discussed Falls evaluation completed     Data saved with a previous flowsheet row definition   1 mo h/o congestion, dry cough, clear rhinorrhea.  No fevers/chills, nausea, diarrhea, ST, HA, ear or tooth pain, dyspnea or wheezing.  Breathing cold air causes cough.  No sick contacts at home.  Non smoker No h/o asthma.  He did test for COVID early in course of infection.   Preventative: COLONOSCOPY Date: 10/2014 sig diverticulosis, rpt 5 yrs Ollen) COLONOSCOPY 10/2019 - 2 TA, diverticulosis, rpt 5 yrs Ollen) Prostate screening - yearly PSA. No nocturia. Strong stream.  Lung cancer screening - not eligible  Flu shot yearly  COVID vaccine Pfizer 12/2019, 01/2020  Prevnar-13 08/2014, pneumovax 2016, prevnar-20 today  Tetanus 08/2016 Zostavax - 06/2012   Shingrix - 09/2019, 02/2020  Advanced directives: scanned in chart 2024. Oldest daughter Rhoda Half is HCPOA followed by Lauraine Faith. Full code, but would not want prolonged life support if terminal condition. Doesn't think would want intubation.  Seat belt use discussed.  Sunscreen use discussed. No changing moles on skin.  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 Interim medical history since our last visit reviewed. Allergies and medications reviewed and updated. Outpatient Medications Prior to Visit  Medication Sig Dispense Refill   aspirin  81 MG EC tablet Take 1 tablet (81 mg total) by mouth every Monday, Wednesday, and Friday. Swallow whole.  docusate sodium (COLACE) 100 MG capsule Take 100 mg by mouth daily.     Psyllium (METAMUCIL PO) Take by mouth daily.     pravastatin  (PRAVACHOL ) 40 MG tablet Take 1 tablet (40 mg total) by mouth daily. 90 tablet 4   No facility-administered medications prior to visit.     Per HPI unless specifically indicated in ROS section below Review of Systems  Constitutional:  Negative for activity change, appetite change, chills, fatigue, fever and unexpected weight change.  HENT:  Negative for hearing loss.   Eyes:  Negative for visual disturbance.  Respiratory:  Positive for cough and shortness of breath (ie singing in choir). Negative for chest tightness  and wheezing.   Cardiovascular:  Negative for chest pain, palpitations and leg swelling.  Gastrointestinal:  Negative for abdominal distention, abdominal pain, blood in stool, constipation, diarrhea, nausea and vomiting.  Genitourinary:  Negative for difficulty urinating and hematuria.  Musculoskeletal:  Negative for arthralgias, myalgias and neck pain.  Skin:  Negative for rash.  Neurological:  Negative for dizziness, seizures, syncope and headaches.  Hematological:  Negative for adenopathy. Does not bruise/bleed easily.  Psychiatric/Behavioral:  Negative for dysphoric mood. The patient is not nervous/anxious.     Objective:  BP 126/80 (Cuff Size: Normal)   Pulse 64   Temp 97.7 F (36.5 C) (Oral)   Ht 5' 6.65 (1.693 m)   Wt 162 lb (73.5 kg)   SpO2 100%   BMI 25.64 kg/m   Wt Readings from Last 3 Encounters:  10/14/24 162 lb (73.5 kg)  09/27/24 156 lb (70.8 kg)  09/09/24 156 lb (70.8 kg)      Physical Exam Vitals and nursing note reviewed.  Constitutional:      General: He is not in acute distress.    Appearance: Normal appearance. He is well-developed. He is not ill-appearing.  HENT:     Head: Normocephalic and atraumatic.     Right Ear: Hearing, tympanic membrane, ear canal and external ear normal.     Left Ear: Hearing, tympanic membrane, ear canal and external ear normal.     Mouth/Throat:     Mouth: Mucous membranes are moist.     Pharynx: Oropharynx is clear. No oropharyngeal exudate or posterior oropharyngeal erythema.  Eyes:     General: No scleral icterus.    Extraocular Movements: Extraocular movements intact.     Conjunctiva/sclera: Conjunctivae normal.     Pupils: Pupils are equal, round, and reactive to light.  Neck:     Thyroid : No thyroid  mass or thyromegaly.     Vascular: No carotid bruit.  Cardiovascular:     Rate and Rhythm: Normal rate and regular rhythm.     Pulses: Normal pulses.          Radial pulses are 2+ on the right side and 2+ on the left  side.     Heart sounds: Normal heart sounds. No murmur heard. Pulmonary:     Effort: Pulmonary effort is normal. No respiratory distress.     Breath sounds: Normal breath sounds. No wheezing, rhonchi or rales.  Abdominal:     General: Bowel sounds are normal. There is no distension.     Palpations: Abdomen is soft. There is no mass.     Tenderness: There is no abdominal tenderness. There is no guarding or rebound.     Hernia: No hernia is present.  Musculoskeletal:        General: Normal range of motion.     Cervical back:  Normal range of motion and neck supple.     Right lower leg: No edema.     Left lower leg: No edema.  Lymphadenopathy:     Cervical: No cervical adenopathy.  Skin:    General: Skin is warm and dry.     Findings: No rash.  Neurological:     General: No focal deficit present.     Mental Status: He is alert and oriented to person, place, and time.  Psychiatric:        Mood and Affect: Mood normal.        Behavior: Behavior normal.        Thought Content: Thought content normal.        Judgment: Judgment normal.       Results for orders placed or performed in visit on 10/07/24  Hemoglobin A1c   Collection Time: 10/07/24  7:28 AM  Result Value Ref Range   Hgb A1c MFr Bld 5.5 4.6 - 6.5 %  PSA   Collection Time: 10/07/24  7:28 AM  Result Value Ref Range   PSA 3.32 0.10 - 4.00 ng/mL  Comprehensive metabolic panel with GFR   Collection Time: 10/07/24  7:28 AM  Result Value Ref Range   Sodium 141 135 - 145 mEq/L   Potassium 4.8 3.5 - 5.1 mEq/L   Chloride 103 96 - 112 mEq/L   CO2 30 19 - 32 mEq/L   Glucose, Bld 100 (H) 70 - 99 mg/dL   BUN 12 6 - 23 mg/dL   Creatinine, Ser 8.92 0.40 - 1.50 mg/dL   Total Bilirubin 0.5 0.2 - 1.2 mg/dL   Alkaline Phosphatase 67 39 - 117 U/L   AST 20 0 - 37 U/L   ALT 14 0 - 53 U/L   Total Protein 6.6 6.0 - 8.3 g/dL   Albumin 4.1 3.5 - 5.2 g/dL   GFR 32.09 >39.99 mL/min   Calcium 9.6 8.4 - 10.5 mg/dL  Lipid panel    Collection Time: 10/07/24  7:28 AM  Result Value Ref Range   Cholesterol 155 0 - 200 mg/dL   Triglycerides 16.9 0.0 - 149.0 mg/dL   HDL 64.29 (L) >60.99 mg/dL   VLDL 83.3 0.0 - 59.9 mg/dL   LDL Cholesterol 896 (H) 0 - 99 mg/dL   Total CHOL/HDL Ratio 4    NonHDL 119.35     Assessment & Plan:   Problem List Items Addressed This Visit     Healthcare maintenance - Primary (Chronic)   Preventative protocols reviewed and updated unless pt declined. Discussed healthy diet and lifestyle.       Advanced care planning/counseling discussion (Chronic)   Advanced directives: scanned in chart 2024. Oldest daughter Rhoda Half is HCPOA followed by Lauraine Faith. Full code, but would not want prolonged life support if terminal condition. Doesn't think would want intubation.       Hypercholesteremia   Chronic, stable on pravastatin  40mg  daily - continue. The 10-year ASCVD risk score (Arnett DK, et al., 2019) is: 24.8%   Values used to calculate the score:     Age: 53 years     Clincally relevant sex: Male     Is Non-Hispanic African American: No     Diabetic: No     Tobacco smoker: No     Systolic Blood Pressure: 126 mmHg     Is BP treated: No     HDL Cholesterol: 35.7 mg/dL     Total Cholesterol: 155 mg/dL  Relevant Medications   pravastatin  (PRAVACHOL ) 40 MG tablet   Prediabetes   A1c levels are back to normal.       Dyspnea due to COVID-19   Tested positive at home for COVID infection about a month ago, now with persistent dyspnea and intermittent cough. Reassuring vitals and lung exam. Anticipate post-COVID dyspnea. Supportive measures reviewed including OTC cough syrup, Rx tessalon  perls. Update if not improving with this.       Other Visit Diagnoses       Encounter for immunization       Relevant Orders   Pneumococcal conjugate vaccine 20-valent (Prevnar 20) (Completed)        Meds ordered this encounter  Medications   pravastatin  (PRAVACHOL ) 40 MG tablet     Sig: Take 1 tablet (40 mg total) by mouth daily.    Dispense:  90 tablet    Refill:  3   benzonatate  (TESSALON ) 100 MG capsule    Sig: Take 1 capsule (100 mg total) by mouth 2 (two) times daily as needed for cough.    Dispense:  30 capsule    Refill:  0    Orders Placed This Encounter  Procedures   Pneumococcal conjugate vaccine 20-valent (Prevnar 20)    Patient Instructions  Prevnar-20 pneumonia shot today  You may call [facility] to discuss repeat colonoscopy at [phone].  For post-COVID cough and shortness of breath, ensure staying well hydrated, get plenty of rest, try over the counter cough syrup like robitussin, delsym prior to church, may tessalon  perls sent to pharmacy. Let me know if worsening shortness of breat or productive cough, fever develops.  Good to see you today Return as needed or in 1 year for next physical.   Follow up plan: Return in about 1 year (around 10/14/2025) for annual exam, prior fasting for blood work, medicare wellness visit.  Anton Blas, MD

## 2024-10-14 NOTE — Assessment & Plan Note (Signed)
 Tested positive at home for COVID infection about a month ago, now with persistent dyspnea and intermittent cough. Reassuring vitals and lung exam. Anticipate post-COVID dyspnea. Supportive measures reviewed including OTC cough syrup, Rx tessalon  perls. Update if not improving with this.

## 2024-10-14 NOTE — Patient Instructions (Addendum)
 Prevnar-20 pneumonia shot today  You may call [facility] to discuss repeat colonoscopy at [phone].  For post-COVID cough and shortness of breath, ensure staying well hydrated, get plenty of rest, try over the counter cough syrup like robitussin, delsym prior to church, may tessalon  perls sent to pharmacy. Let me know if worsening shortness of breat or productive cough, fever develops.  Good to see you today Return as needed or in 1 year for next physical.

## 2024-11-18 ENCOUNTER — Encounter: Payer: Self-pay | Admitting: Internal Medicine

## 2024-12-27 ENCOUNTER — Ambulatory Visit: Admitting: Podiatry

## 2025-01-17 ENCOUNTER — Ambulatory Visit: Admitting: Internal Medicine

## 2025-10-08 ENCOUNTER — Other Ambulatory Visit

## 2025-10-08 ENCOUNTER — Ambulatory Visit

## 2025-10-15 ENCOUNTER — Encounter: Admitting: Family Medicine
